# Patient Record
Sex: Male | Born: 1990
Health system: Southern US, Academic
[De-identification: ages and names within clinical notes are randomized; demographics above are authoritative.]

## PROBLEM LIST (undated history)

## (undated) ENCOUNTER — Telehealth

## (undated) ENCOUNTER — Encounter

## (undated) ENCOUNTER — Other Ambulatory Visit

## (undated) MED FILL — FLUARIX QUADRIVALENT 0.5ML SUSY: 0.5 ML | 1 days supply | Qty: 1 | Fill #0 | Status: AC

## (undated) NOTE — Progress Notes (Signed)
 Formatting of this note is different from the original. Office Visit Note:  Assessment/Plan: 1. Attention deficit hyperactivity disorder (ADHD), unspecified ADHD type   2. Cellulitis and abscess of hand   3. Alopecia   4. Healthcare maintenance    1. ADHD-he states that he has been diagnosed with ADHD and is getting Adderall from a doctor in [facility] .  He also tells me that he has a lot of anxiety and has situational depression.  He was on Wellbutrin in the past which is not taking anymore.  I told him that he needs to be seen by psychiatrist to weigh the risks and benefits of taking Adderall.  I will refer him to a psychiatrist now. 2. Cellulitis of his hand-improving, no more streaking.  On Keflex, advised him to continue the course of Keflex 3. Alopecia-all Propecia, appears stable  Health Maintenance: Immunizations: Tetanus: Last in July 2021 Influenza: got it this season Covid: 3 doses of pfizer  Screening: Hepatitis C: Deferred HIV:  Deferred  diabetes: Deferred Plan to check lipid panel with next set of labs  Orders Placed This Encounter  ? REFERRAL TO PSYCHIATRY    Referral Priority:   Routine    Referral Type:   Behavioral Health    Referral Reason:   Specialty Services Required    Referred to Provider:   Lanie Gull    Requested Specialty:   Psychiatry    Number of Visits Requested:   1  ? cephALEXin (KEFLEX) 500 mg capsule    Sig: TAKE 1 CAPSULE BY MOUTH EVERY 6 HOURS FOR 10 DAYS  ? hydrOXYzine HCL (ATARAX) 25 mg tablet    Sig: TAKE 1 TABLET BY MOUTH THREE TIMES DAILY FOR 5 DAYS AS NEEDED   Social Determinants of Health   Tobacco Use: Low Risk   ? Smoking Tobacco Use: Never Smoker  ? Smokeless Tobacco Use: Never Used  Alcohol Use:   ? Frequency of Alcohol Consumption: Not on file  ? Average Number of Drinks: Not on file  ? Frequency of Binge Drinking: Not on file  Financial Resource Strain:   ? Difficulty of Paying Living Expenses: Not on file   Food Insecurity:   ? Worried About Running Out of Food in the Last Year: Not on file  ? Ran Out of Food in the Last Year: Not on file  Transportation Needs:   ? Lack of Transportation (Medical): Not on file  ? Lack of Transportation (Non-Medical): Not on file  Physical Activity:   ? Days of Exercise per Week: Not on file  ? Minutes of Exercise per Session: Not on file  Stress:   ? Feeling of Stress : Not on file  Social Connections:   ? Frequency of Communication with Friends and Family: Not on file  ? Frequency of Social Gatherings with Friends and Family: Not on file  ? Attends Religious Services: Not on file  ? Active Member of Clubs or Organizations: Not on file  ? Attends Banker Meetings: Not on file  ? Marital Status: Not on file  Intimate Partner Violence:   ? Fear of Current or Ex-Partner: Not on file  ? Emotionally Abused: Not on file  ? Physically Abused: Not on file  ? Sexually Abused: Not on file  Depression:   ? PHQ-2 Score: Not on file  Housing Stability:   ? Unable to Pay for Housing in the Last Year: Not on file  ? Number of Places Lived in the Last  Year: Not on file  ? Unstable Housing in the Last Year: Not on file   Follow-up and Dispositions    Return in about 3 months (around 10/27/2020).   I have reviewed with the patient details of the assessment and plan and all questions were answered. Relevant patient education was performed. The most recent lab findings were reviewed with the patient. An After Visit Summary was printed and given to the patient.  Reason for Visit: Establish Care  Subjective: 53 y.o. male with h/o ADHD, alopecia who comes to establish with me as a primary care physician.  He states that he wants me to prescribe his Adderall that is why he is here.  He had a recent visit to the ED for cellulitis of his hands due to some insect bite which he states is improving.  He denies any other complaints to me.  No headaches no  blurred vision no chest pain or shortness of breath no abdominal pain no nausea no vomiting no diarrhea constipation.  Review of Systems A complete 11 system ROS was preformed (constitutional, eyes, ENT, cardiovascular, respiratory, gastrointestinal, genitourinary, musculoskeletal, skin, neurological, psychiatric) and was negative aside from the pertinent positives and negatives noted in the HPI.  Past Medical History:  Diagnosis Date  ? ADHD   ? Anxiety   ? Depression   ? Hair loss    History reviewed. No pertinent surgical history. Social History   Socioeconomic History  ? Marital status: SINGLE  Tobacco Use  ? Smoking status: Never Smoker  ? Smokeless tobacco: Never Used  Substance and Sexual Activity  ? Alcohol use: Yes    Comment: 6-8 weekly   ? Drug use: Not Currently  ? Sexual activity: Yes   Family History  Problem Relation Age of Onset  ? Psychiatric Disorder Mother   ? No Known Problems Father   ? Psychiatric Disorder Sister   ? Attention Deficit Hyperactivity Disorder Sister   ? Psychiatric Disorder Brother   ? Attention Deficit Hyperactivity Disorder Brother   ? Ulcerative Colitis Brother    Current Outpatient Medications  Medication Sig Dispense Refill  ? cephALEXin (KEFLEX) 500 mg capsule TAKE 1 CAPSULE BY MOUTH EVERY 6 HOURS FOR 10 DAYS    ? hydrOXYzine HCL (ATARAX) 25 mg tablet TAKE 1 TABLET BY MOUTH THREE TIMES DAILY FOR 5 DAYS AS NEEDED    ? amphetamine-dextroamphetamine XR (ADDERALL XR) 30 mg XR capsule Take 30 mg by mouth Every morning.    ? finasteride (PROPECIA) 1 mg tablet Take 1 mg by mouth daily.     Allergies  Allergen Reactions  ? Venom-Wasp Hives   Objective: Visit Vitals BP 126/76 (BP 1 Location: Left upper arm, BP Patient Position: Semi fowlers, BP Cuff Size: Adult)  Pulse 96  Temp 96.9 F (36.1 C) (Oral)  Resp 17  Ht 5' 6 (1.676 m)  Wt 181 lb (82.1 kg)  SpO2 98%  BMI 29.21 kg/m   Physical Exam:  AA&O x3. Not pale, not in any  distress. HEENT: ENT negative.. Lungs: clear Heart: S1 S2 +, RRR Neuro: No focal deficits. Psych: Normal affect and mood. Results for orders placed or performed during the hospital encounter of 01/19/20  CBC WITH AUTOMATED DIFF  Result Value Ref Range   WBC 9.4 4.6 - 13.2 K/uL   RBC 5.04 4.35 - 5.65 M/uL   HGB 16.3 (H) 13.0 - 16.0 g/dL   HCT 53.4 63.9 - 51.9 %   MCV 92.3 74.0 - 97.0 FL  MCH 32.3 24.0 - 34.0 PG   MCHC 35.1 31.0 - 37.0 g/dL   RDW 87.8 88.3 - 85.4 %   PLATELET 312 135 - 420 K/uL   MPV 9.2 9.2 - 11.8 FL   NEUTROPHILS 69 40 - 73 %   LYMPHOCYTES 21 21 - 52 %   MONOCYTES 7 3 - 10 %   EOSINOPHILS 3 0 - 5 %   BASOPHILS 0 0 - 2 %   ABS. NEUTROPHILS 6.4 1.8 - 8.0 K/UL   ABS. LYMPHOCYTES 1.9 0.9 - 3.6 K/UL   ABS. MONOCYTES 0.6 0.05 - 1.2 K/UL   ABS. EOSINOPHILS 0.3 0.0 - 0.4 K/UL   ABS. BASOPHILS 0.0 0.0 - 0.1 K/UL   DF AUTOMATED    METABOLIC PANEL, COMPREHENSIVE  Result Value Ref Range   Sodium 138 136 - 145 mmol/L   Potassium 3.8 3.5 - 5.5 mmol/L   Chloride 106 100 - 111 mmol/L   CO2 29 21 - 32 mmol/L   Anion gap 3 3.0 - 18 mmol/L   Glucose 90 74 - 99 mg/dL   BUN 12 7.0 - 18 MG/DL   Creatinine 9.07 0.6 - 1.3 MG/DL   BUN/Creatinine ratio 13 12 - 20     GFR est AA >60 >60 ml/min/1.1m2   GFR est non-AA >60 >60 ml/min/1.27m2   Calcium 9.1 8.5 - 10.1 MG/DL   Bilirubin, total 0.6 0.2 - 1.0 MG/DL   ALT (SGPT) 849 (H) 16 - 61 U/L   AST (SGOT) 43 (H) 10 - 38 U/L   Alk. phosphatase 115 45 - 117 U/L   Protein, total 8.3 (H) 6.4 - 8.2 g/dL   Albumin 4.0 3.4 - 5.0 g/dL   Globulin 4.3 (H) 2.0 - 4.0 g/dL   A-G Ratio 0.9 0.8 - 1.7     Renee Artist, MD, FACP, [facility]. [facility] [place], [place], [place].   Electronically signed by Nair, Prem K, MD at 07/29/2020  9:22 AM EST

## (undated) NOTE — Progress Notes (Signed)
 Formatting of this note is different from the original. Chief Complaint  Patient presents with  ? Establish Care   1. Have you been to the ER, urgent care clinic since your last visit?  Hospitalized since your last visit?No  2. Have you seen or consulted any other health care providers outside of the [facility] since your last visit?  Include any pap smears or colon screening. No  Electronically signed by Tanda Rock ORN at 07/29/2020  8:56 AM EST

---
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
## 2009-01-08 ENCOUNTER — Ambulatory Visit: Payer: Self-pay | Admitting: Family Medicine

## 2016-07-12 ENCOUNTER — Encounter (HOSPITAL_COMMUNITY): Payer: Self-pay

## 2016-07-12 ENCOUNTER — Emergency Department (HOSPITAL_COMMUNITY): Payer: BLUE CROSS/BLUE SHIELD

## 2016-07-12 ENCOUNTER — Emergency Department (HOSPITAL_COMMUNITY)
Admission: EM | Admit: 2016-07-12 | Discharge: 2016-07-12 | Disposition: A | Discharge status: Home / Self Care | Payer: BLUE CROSS/BLUE SHIELD | Attending: Emergency Medicine | Admitting: Emergency Medicine

## 2016-07-12 DIAGNOSIS — Y939 Activity, unspecified: Secondary | ICD-10-CM | POA: Diagnosis not present

## 2016-07-12 DIAGNOSIS — Z79899 Other long term (current) drug therapy: Secondary | ICD-10-CM | POA: Insufficient documentation

## 2016-07-12 DIAGNOSIS — F909 Attention-deficit hyperactivity disorder, unspecified type: Secondary | ICD-10-CM | POA: Insufficient documentation

## 2016-07-12 DIAGNOSIS — Y999 Unspecified external cause status: Secondary | ICD-10-CM | POA: Insufficient documentation

## 2016-07-12 DIAGNOSIS — S060X0A Concussion without loss of consciousness, initial encounter: Secondary | ICD-10-CM | POA: Diagnosis not present

## 2016-07-12 DIAGNOSIS — Y929 Unspecified place or not applicable: Secondary | ICD-10-CM | POA: Diagnosis not present

## 2016-07-12 DIAGNOSIS — S0990XA Unspecified injury of head, initial encounter: Secondary | ICD-10-CM | POA: Diagnosis present

## 2016-07-12 DIAGNOSIS — W01198A Fall on same level from slipping, tripping and stumbling with subsequent striking against other object, initial encounter: Secondary | ICD-10-CM | POA: Diagnosis not present

## 2016-07-12 LAB — URINALYSIS, ROUTINE W REFLEX MICROSCOPIC
BILIRUBIN URINE: NEGATIVE
Glucose, UA: NEGATIVE mg/dL
Hgb urine dipstick: NEGATIVE
KETONES UR: NEGATIVE mg/dL
LEUKOCYTES UA: NEGATIVE
NITRITE: NEGATIVE
Protein, ur: NEGATIVE mg/dL
SPECIFIC GRAVITY, URINE: 1.017 (ref 1.005–1.030)
pH: 5 (ref 5.0–8.0)

## 2016-07-12 LAB — RAPID URINE DRUG SCREEN, HOSP PERFORMED
Amphetamines: NOT DETECTED
BENZODIAZEPINES: POSITIVE — AB
Barbiturates: NOT DETECTED
COCAINE: NOT DETECTED
OPIATES: NOT DETECTED
Tetrahydrocannabinol: NOT DETECTED

## 2016-07-12 LAB — I-STAT CHEM 8, ED
BUN: 12 mg/dL (ref 6–20)
Calcium, Ion: 1.21 mmol/L (ref 1.15–1.40)
Chloride: 103 mmol/L (ref 101–111)
Creatinine, Ser: 0.9 mg/dL (ref 0.61–1.24)
Glucose, Bld: 84 mg/dL (ref 65–99)
HEMATOCRIT: 39 % (ref 39.0–52.0)
Hemoglobin: 13.3 g/dL (ref 13.0–17.0)
POTASSIUM: 3.9 mmol/L (ref 3.5–5.1)
SODIUM: 141 mmol/L (ref 135–145)
TCO2: 25 mmol/L (ref 0–100)

## 2016-07-12 LAB — ETHANOL: Alcohol, Ethyl (B): <5 mg/dL (ref <5)

## 2016-07-12 MED ORDER — ACETAMINOPHEN 500 MG PO TABS: 500.0000 mg | ORAL_TABLET | Freq: Four times a day (QID) | ORAL | 0 refills | Status: DC | PRN

## 2016-07-12 NOTE — ED Notes (Signed)
No respiratory or acute distress noted alert and oriented x 3 clear speech noted moves all extremities visitor at bedside call light in reach. 

## 2016-07-12 NOTE — ED Provider Notes (Signed)
[facility]-EMERGENCY DEPT Provider Note   CSN: [id number] Arrival date & time: 07/12/16  1626     History   Chief Complaint Chief Complaint  Patient presents with  . Head Injury  . Fall    HPI Tommy Crawford is a 25 y.o. male.  Tommy Crawford is a 25 y.o. Male who presents to the ED with his brother and friend who report last night after drinking some alcohol the patient tripped on a side table and fell. They report after this fall his balance has been off and he has been more sleepy since. They report he was drinking alcohol, but "not that much." They report today he has been more sleepy and fatigued. Family reports he seems to be improving and his gait is much better. Patient tells me he just feels tired and denies other complaints. He has taken nothing for treatment today. He denies headache. He denies alcohol or illicit substance use today. He denies fevers, neck pain, back pain, numbness, tingling, weakness, changes to his vision, headache, abdominal pain, nausea, vomiting, or double vision.   The history is provided by the patient, a friend and a relative. No language interpreter was used.  Head Injury   Pertinent negatives include no numbness, no vomiting and no weakness.  Fall  Pertinent negatives include no chest pain, no abdominal pain, no headaches and no shortness of breath.    History reviewed. No pertinent past medical history.  Patient Active Problem List   Diagnosis Date Noted  . ADD 07/24/2008    History reviewed. No pertinent surgical history.     Home Medications    Prior to Admission medications   Medication Sig Start Date End Date Taking? Authorizing Provider  ADDERALL XR 30 MG 24 hr capsule Take 30 mg by mouth every morning. 06/16/16  Yes Historical Provider, MD  finasteride (PROPECIA) 1 MG tablet Take 1 mg by mouth daily. 05/28/16  Yes Historical Provider, MD  acetaminophen (TYLENOL) 500 MG tablet Take 1 tablet (500 mg total) by mouth  every 6 (six) hours as needed for headache. 07/12/16   Everlene FarrierWilliam Donielle Kaigler, PA-C    Family History History reviewed. No pertinent family history.  Social History Social History  Substance Use Topics  . Smoking status: Never Smoker  . Smokeless tobacco: Never Used  . Alcohol use Yes     Allergies   Patient has no known allergies.   Review of Systems Review of Systems  Constitutional: Positive for fatigue. Negative for chills and fever.  HENT: Negative for congestion and sore throat.   Eyes: Negative for pain and visual disturbance.  Respiratory: Negative for cough and shortness of breath.   Cardiovascular: Negative for chest pain.  Gastrointestinal: Negative for abdominal pain, nausea and vomiting.  Genitourinary: Negative for difficulty urinating and dysuria.  Musculoskeletal: Positive for gait problem. Negative for back pain, neck pain and neck stiffness.  Skin: Negative for rash.  Neurological: Negative for syncope, weakness, numbness and headaches.     Physical Exam Updated Vital Signs BP 127/79 (BP Location: Left Arm)   Pulse 84   Temp 98.6 F (37 C) (Oral)   Resp 18   Ht 5\' 7"  (1.702 m)   Wt 65.8 kg   SpO2 98%   BMI 22.71 kg/m   Physical Exam  Constitutional: He is oriented to person, place, and time. He appears well-developed and well-nourished. No distress.  Nontoxic-appearing.  HENT:  Head: Normocephalic and atraumatic.  Right Ear: External ear normal.  Left  Ear: External ear normal.  Nose: Nose normal.  Mouth/Throat: Oropharynx is clear and moist.  Bilateral tympanic membranes are pearly-gray without erythema or loss of landmarks.  No visible are palpated evidence of head injury or trauma.  Eyes: Conjunctivae and EOM are normal. Pupils are equal, round, and reactive to light. Right eye exhibits no discharge. Left eye exhibits no discharge.  Neck: Normal range of motion. Neck supple. No JVD present.  No midline neck tenderness.  Cardiovascular: Normal  rate, regular rhythm, normal heart sounds and intact distal pulses.  Exam reveals no gallop and no friction rub.   No murmur heard. Bilateral radial pulses are intact.   Pulmonary/Chest: Effort normal and breath sounds normal. No stridor. No respiratory distress. He has no wheezes. He has no rales. He exhibits no tenderness.  Abdominal: Soft. There is no tenderness. There is no guarding.  Abdomen is soft nontender to palpation.  Musculoskeletal: Normal range of motion. He exhibits no edema.  Lymphadenopathy:    He has no cervical adenopathy.  Neurological: He is alert and oriented to person, place, and time. He displays normal reflexes. No cranial nerve deficit or sensory deficit. Coordination normal.  Patient is alert and oriented 3. He has amnesia to the event of falling last night. Cranial nerves are intact. Speech is clear and coherent. EOMs are intact. Finger to nose is intact. Normal gait. Bilateral patellar DTRs are intact. No pronator drift. Sensation is intact his bilateral upper and lower extremities.  Skin: Skin is warm and dry. Capillary refill takes less than 2 seconds. No rash noted. He is not diaphoretic. No erythema. No pallor.  Psychiatric: He has a normal mood and affect. His behavior is normal.  Nursing note and vitals reviewed.    ED Treatments / Results  Labs (all labs ordered are listed, but only abnormal results are displayed) Labs Reviewed  RAPID URINE DRUG SCREEN, HOSP PERFORMED - Abnormal; Notable for the following:       Result Value   Benzodiazepines POSITIVE (*)    All other components within normal limits  ETHANOL  URINALYSIS, ROUTINE W REFLEX MICROSCOPIC  I-STAT CHEM 8, ED    EKG  EKG Interpretation None       Radiology Ct Head Wo Contrast  Result Date: 07/12/2016 CLINICAL DATA:  Unsteady gait and confusion after fall and hitting head x2 last night. EXAM: CT HEAD WITHOUT CONTRAST TECHNIQUE: Contiguous axial images were obtained from the base of  the skull through the vertex without intravenous contrast. COMPARISON:  None. FINDINGS: BRAIN: The ventricles and sulci are normal. No intraparenchymal hemorrhage, mass effect nor midline shift. No acute large vascular territory infarcts. Grey-white matter distinction is maintained. The basal ganglia are unremarkable. No abnormal extra-axial fluid collections. Basal cisterns are patent. The brainstem and cerebellar hemispheres are without acute abnormalities. VASCULAR: Unremarkable. SKULL/SOFT TISSUES: No skull fracture. No significant soft tissue swelling. ORBITS/SINUSES: The included ocular globes and orbital contents are normal.The mastoid air cells are clear. The included paranasal sinuses are well-aerated. OTHER: None. IMPRESSION: No acute intracranial abnormality.  No acute osseous abnormality. Electronically Signed   By: Tollie Ethavid  Kwon M.D.   On: 07/12/2016 19:19    Procedures Procedures (including critical care time)  Medications Ordered in ED Medications - No data to display   Initial Impression / Assessment and Plan / ED Course  I have reviewed the triage vital signs and the nursing notes.  Pertinent labs & imaging results that were available during my care of the patient  were reviewed by me and considered in my medical decision making (see chart for details).  Clinical Course    This is a 25 y.o. Male who presents to the ED with his brother and friend who report last night after drinking some alcohol the patient tripped on a side table and fell. They report after this fall his balance has been off and he has been more sleepy since. They report he was drinking alcohol, but "not that much." They report today he has been more sleepy and fatigued. Family reports he seems to be improving and his gait is much better. Patient tells me he just feels tired and denies other complaints. He has taken nothing for treatment today. He denies headache. He denies alcohol or illicit substance use today.  On  exam the patient is afebrile nontoxic appearing. He has no focal neurological deficits. He is able to ambulate with normal gait. Speech is clear and coherent. No visible or palpated signs of head injury or trauma. I-STAT Chem-8 is unremarkable. Ethanol level is undetectable. Urinalysis without abnormalities. Urine drug screen is positive for benzodiazepines only. CT head without contrast is unremarkable. At recheck patient tells me he is feeling back to normal. He feels ready for discharge. I discussed about the benzodiazepines. Father, who is at bedside thinks this might be the culprit. He believes he might have actually taken a benzodiazepine and this caused his gait to be wobbly with a combination of alcohol. This seems to be likely is a patient denies any headache. I do suspect he likely has a concussion as he has amnesia to the event. I discussed head injury return precautions. I discussed the expected course and treatment of a concussion. I advised to avoid activities that would risk his head injured. I advised the patient to follow-up with their primary care provider this week. I advised the patient to return to the emergency department with new or worsening symptoms or new concerns. The patient and father verbalized understanding and agreement with plan.      Final Clinical Impressions(s) / ED Diagnoses   Final diagnoses:  Concussion without loss of consciousness, initial encounter    New Prescriptions New Prescriptions   ACETAMINOPHEN (TYLENOL) 500 MG TABLET    Take 1 tablet (500 mg total) by mouth every 6 (six) hours as needed for headache.     Everlene Farrier, PA-C 07/12/16 2037    Pricilla Loveless, MD 07/13/16 [phone]

## 2016-07-12 NOTE — ED Triage Notes (Addendum)
Pt c/o unsteady gait and confusion after falling and hitting head last night x 2.  Denies pain.  Pt reports "I'm moving slow."  Pt's friend reports that the Pt was drinking ETOH last night prior to the fall and Pt fell out of bed last night.    Pupils are equal and reactive.

## 2016-07-12 NOTE — ED Notes (Signed)
Pt is aware urine is needed 

## 2017-02-26 DIAGNOSIS — Z021 Encounter for pre-employment examination: Principal | ICD-10-CM

## 2017-04-15 ENCOUNTER — Encounter: Attending: Family Medicine

## 2017-06-23 ENCOUNTER — Ambulatory Visit: Attending: Family Medicine

## 2017-06-23 DIAGNOSIS — Z23 Encounter for immunization: Principal | ICD-10-CM

## 2017-06-23 DIAGNOSIS — F902 Attention-deficit hyperactivity disorder, combined type: Secondary | ICD-10-CM

## 2017-06-23 MED ORDER — FINASTERIDE PO LIQD 1 MG/ML
1 mg | ORAL
Start: 2017-06-23 — End: 2017-06-24

## 2017-06-23 MED ORDER — AMPHETAMINE-DEXTROAMPHETAMINE 30 MG PO TABS
30 mg | Freq: Every day | ORAL | 0 refills | Status: CP
Start: 2017-06-23 — End: 2017-06-24

## 2017-06-23 MED ORDER — AMPHETAMINE-DEXTROAMPHETAMINE 30 MG PO TABS
30 mg | Freq: Every day | ORAL
Start: 2017-06-23 — End: 2017-06-24

## 2017-06-23 MED ORDER — AMPHETAMINE-DEXTROAMPHETAMINE 30 MG PO TABS
30 mg | Freq: Every day | ORAL | 0 refills | Status: CP
Start: 2017-06-23 — End: 2017-08-26

## 2017-06-23 MED ORDER — FINASTERIDE 1 MG PO TABS
1 mg | ORAL
Start: 2017-06-23 — End: 2017-07-24

## 2017-06-24 NOTE — Progress Notes
?   Alcohol use Yes      Comment: occassionally    ? Drug use: Unknown   ? Sexual activity: Yes     Partners: Female     Other Topics Concern   ? Not on file     Social History Narrative   ? No narrative on file     No current outpatient prescriptions on file prior to visit.     No current facility-administered medications on file prior to visit.      No Known Allergies      Review of Systems  See HPI        Objective:        VITAL SIGNS (all recorded)      Clinic Vitals     Row Name 06/23/17 1546                Amb Encounter Vitals    Weight 70.5 kg (155 lb 8 oz)    -VY at 06/23/17 1547       Height 1.676 m (5\' 6" )    -VY at 06/23/17 1547       BMI (Calculated) 25.15    -VY at 06/23/17 1547       BSA (Calculated - sq m) 1.81    -VY at 06/23/17 1547       BP 129/81    -VY at 06/23/17 1547       BP Location Left upper arm    -VY at 06/23/17 1547       Position Sitting    -VY at 06/23/17 1547       Pulse 87    -VY at 06/23/17 1547       Pulse Source Radial    -VY at 06/23/17 1547       Pulse Quality Normal    -VY at 06/23/17 1547       Resp 16    -VY at 06/23/17 1547       Respiration Quality Normal    -VY at 06/23/17 1547       Temp 36.6 ?C (97.8 ?F)    -VY at 06/23/17 1547       Temperature Source Oral    -VY at 06/23/17 1547       Pain Score Zero    -VY at 06/23/17 1547          Education/Communication Barriers?    Learning/Communication Barriers? No    -VY at 06/23/17 1547          Fall Risk Assessment    Had recent fall / Last 6 months? No recent fall    -VY at 06/23/17 1547       Does patient have a fear of falling? No    -VY at 06/23/17 1547         User Key  (r) = Recorded By, (t) = Taken By, (c) = Cosigned By    Initials Name Effective Dates    Phoebe PerchVY Young, Vanessa, [place] 10/22/15 -         Physical Exam   Vital signs reviewed. Alert and oriented x 3, well developed, well nourished, BMI 25, NAD. HEENT: PERRLA, EOMI. Conjunctiva not injected. Neck: Supple. No adenopathy in the neck or supraclavicular area. No

## 2017-06-24 NOTE — Progress Notes
?   USPSTF HIV Risk Assessment  Completed   ? Influenza Vaccine  Completed   ? Varicella Vaccine  Excluded

## 2017-06-24 NOTE — Progress Notes
Orders Placed This Encounter   Medications   ? DISCONTD: amphetamine-dextroamphetamine (ADDERALL) 30 MG PO Tablet     Sig: Take 1 tablet by mouth daily.     Dispense:  30 tablet     Refill:  0     Order Specific Question:   Prior to prescribing this controlled substance, has the [place] PDMP website been accessed by yourself or designated staff?     Answer:   Yes, The PDMP website has been accessed   ? DISCONTD: amphetamine-dextroamphetamine (ADDERALL) 30 MG PO Tablet     Sig: Take 1 tablet by mouth daily. Earliest Fill Date: 07/12/17     Dispense:  30 tablet     Refill:  0     Order Specific Question:   Prior to prescribing this controlled substance, has the [place] PD[place] website been accessed by yourself or designated staff?     Answer:   Yes, The PDMP website has been accessed   ? DISCONTD: amphetamine-dextroamphetamine (ADDERALL) 30 MG PO Tablet     Sig: Take 1 tablet by mouth daily. Earliest Fill Date: 08/11/17     Dispense:  30 tablet     Refill:  0     Order Specific Question:   Prior to prescribing this controlled substance, has the [place] PD[place] website been accessed by yourself or designated staff?     Answer:   Yes, The PDMP website has been accessed   ? amphetamine-dextroamphetamine (ADDERALL) 30 MG PO Tablet     Sig: Take 1 tablet by mouth daily. Earliest Fill Date: 09/10/17     Dispense:  30 tablet     Refill:  0     Order Specific Question:   Prior to prescribing this controlled substance, has the [place] PD[place] website been accessed by yourself or designated staff?     Answer:   Yes, The PDMP website has been accessed     Orders Placed This Encounter   Procedures   ? Flu vaccine (6 mo and older) QUADRIVALENT, Preserv-Free, Single dose, IM       Health Maintenance was reviewed. The patient's HM Topic list was:                                            Health Maintenance   Topic Date Due   ? Preventive Wellness Visit  02/19/2008   ? DTaP,Tdap,and Td Vaccines (1 - Tdap) 02/18/2009

## 2017-06-24 NOTE — Progress Notes
Subjective:   Robert Velasquez is a 26 y.o. male being seen today for Establish Care (E forcse checked); Chronic Care (HIV Risk Assessment,Wellness); and Medications Refill (Adderall XR 30mg  one a day)       HPI  26 year old male here to establish. States he recently moved here to work a [employer] [facility]. 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 He states he mainly wants to establish to get a refill of medications for ADHD. He has had the dx since around age 89. States there was a time in adolescents when he was also treated for depression but it resolved. He has not been following up with Psychiatry. States the medication mainly helps to keep him focused on tasks. He has had less hyperactivity as he has entered adulthood. Has not had any recent holidays. Denies any other health issuses. He does not smoke or drink. Does not exercise regularly. States he sleeps well. Rates his functional status as good overall. HIV risk assessment: Patient denies increased HIV risk including sex with men, multiple sexual partners, IVDU history, weight loss or night sweats.   Denies any adverse effects from current medications. Denies any fever or chills. Denies any chest pain, palpitations or increased shortness of breath. No cough or hemoptysis.  Denies any nausea or vomiting. Denies any change in bowel function or hematochezia.  Denies any genitourinary complaints. Denies paresthesias or weakness.  No joint swelling. No impairments in gait or balance, no falls. No visual complaints and no cognitive complaints. Hearing is normal. ROS otherwise negative.        No past medical history on file.  No past surgical history on file.  No family history on file.  Social History     Social History   ? Marital status: Unknown     Spouse name: N/A   ? Number of children: N/A   ? Years of education: N/A     Occupational History   ? Not on file.     Social History Main Topics   ? Smoking status: Never Smoker   ? Smokeless tobacco: Never Used

## 2017-06-24 NOTE — Progress Notes
thyromegaly, no bruits, no JVDs. Heart: RRR with no murmurs noted. Lungs: Clear to ausc. Good respiratory effort. Abdomen: Positive bowel sounds, soft, nontender. No hepatosplenomegaly. Extremities: WNL with no clubbing, cyanosis, or edema. Pulses intact x four extremities. Joints: no effusions or swelling noted. Gait and station normal. Skin: Appears intact and normal to palpation. Neuro: Cranial nerves intact. No abnormality or focal deficits in extremities x four. No tremors or ataxia noted.      Labs or Imaging Review:   Lab Requisition on 02/26/2017   Component Date Value   ? Hepatitis B Surface Anti* 02/26/2017 Positive    ? Mumps IgG Antibody 02/26/2017 Positive*   ? Rubella Immune Status 02/26/2017 Not Immune*   ? Rubeola IgG Antibody 02/26/2017 Equivocal*   ? Varicella Immune Status 02/26/2017 Immune    ? HDL 02/26/2017 72    ? Cholesterol, Total 02/26/2017 170    ? Glucose 02/26/2017 101*       Assessment:       ICD-10-CM ICD-9-CM    1. Need for prophylactic vaccination and inoculation against influenza Z23 V04.81 Flu vaccine (6 mo and older) QUADRIVALENT, Preserv-Free, Single dose, IM   2. Attention deficit hyperactivity disorder (ADHD), combined type F90.2 314.01 amphetamine-dextroamphetamine (ADDERALL) 30 MG PO Tablet      DISCONTINUED: amphetamine-dextroamphetamine (ADDERALL) 30 MG PO Tablet      DISCONTINUED: amphetamine-dextroamphetamine (ADDERALL) 30 MG PO Tablet      DISCONTINUED: amphetamine-dextroamphetamine (ADDERALL) 30 MG PO Tablet          Plan:   Immunizations: Flu. Discussed also Td update- he will check his records.   RX:  Continue current prescription medications  Goals: Increase exercise, BP < 130/80, stable function.   Referrals:  Discussed Psychiatry prn  Education / Counseling: Diet, medical condition, health maintenance, exercise, medications,medication risks of addiction/dependency, treatment plans and options.  Follow-up: 3 Months  or sooner prn.

## 2017-07-23 DIAGNOSIS — Z87438 Personal history of other diseases of male genital organs: Principal | ICD-10-CM

## 2017-07-23 DIAGNOSIS — F909 Attention-deficit hyperactivity disorder, unspecified type: Principal | ICD-10-CM

## 2017-07-23 MED ORDER — FINASTERIDE 1 MG PO TABS
1 mg | Freq: Every day | ORAL | 1 refills | Status: CP
Start: 2017-07-23 — End: 2017-08-26

## 2017-07-23 MED ORDER — AMPHETAMINE-DEXTROAMPHET ER 30 MG PO CP24
30 mg | Freq: Every morning | ORAL | 0 refills | Status: CP
Start: 2017-07-23 — End: 2017-08-26

## 2017-08-06 IMAGING — CT CT HEAD W/O CM
3 of 4 series · 14 of 47 positions shown, 16 images · non-contrast
Comparison: None.

CLINICAL DATA: Unsteady gait and confusion after fall and hitting
head x2 last night.

EXAM:
CT HEAD WITHOUT CONTRAST
TECHNIQUE: Contiguous axial images were obtained from the base of the skull
through the vertex without intravenous contrast.

[Series 2: head w/o · axial · non-contrast · 0.45mm/px · z∈[-192,-52]mm · 8 of 34 slices shown, 10 images]
[im 3/34  brain]
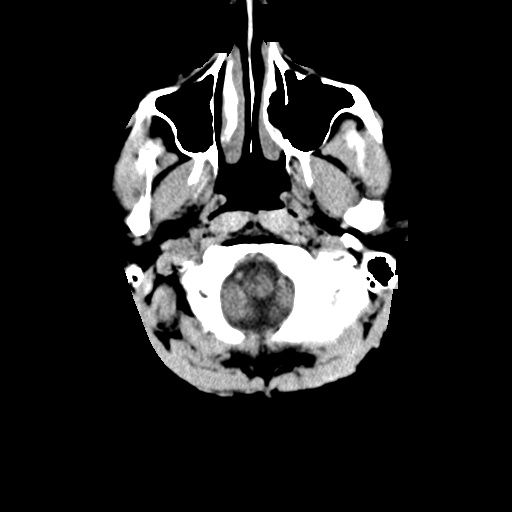
[im 3/34  bone]
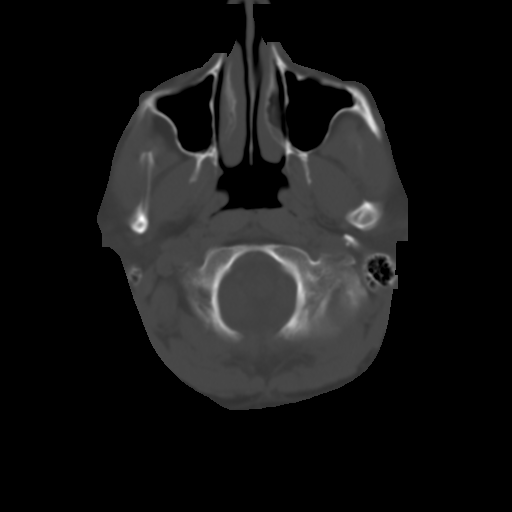
[im 8/34  brain]
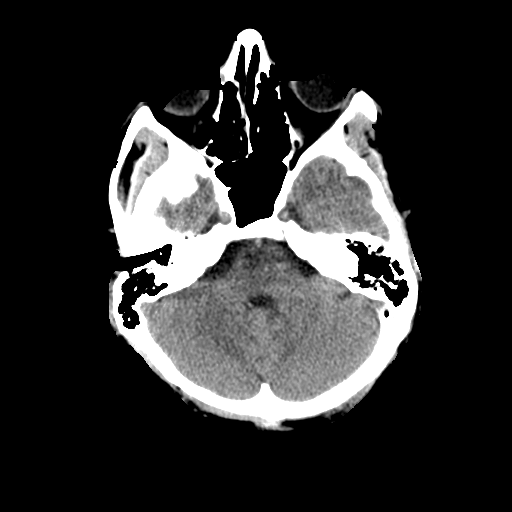
[im 12/34  brain]
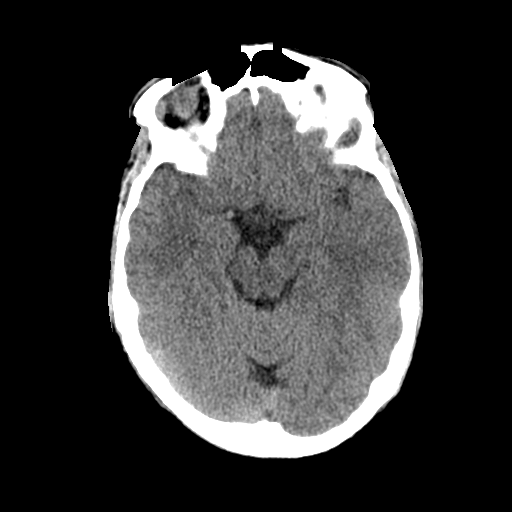
[im 15/34  brain]
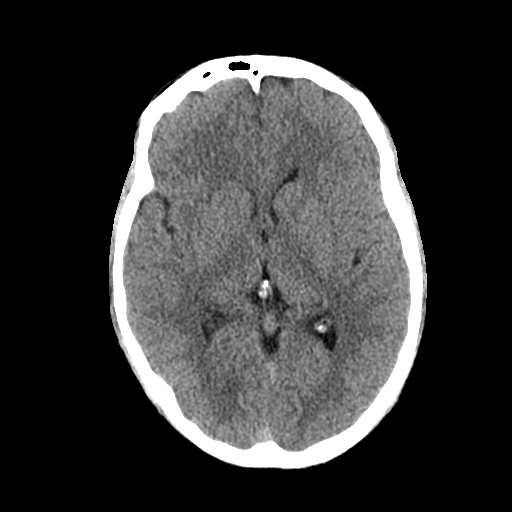
[im 19/34  brain]
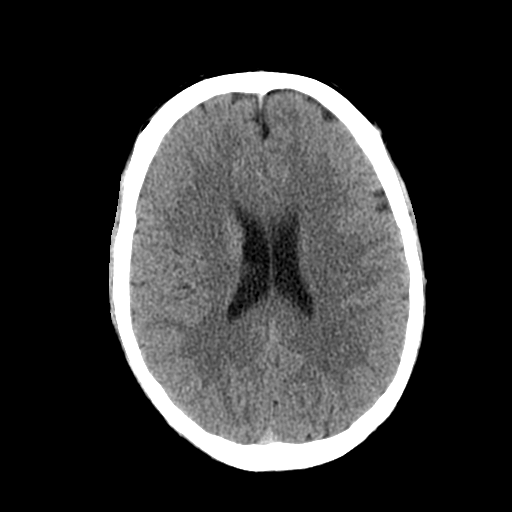
[im 19/34  bone]
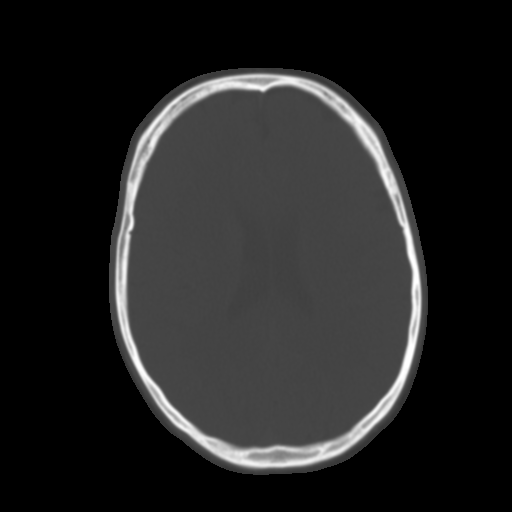
[im 22/34  brain]
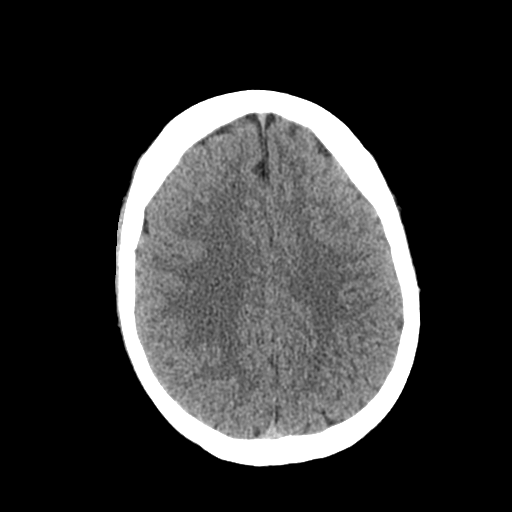
[im 26/34  brain]
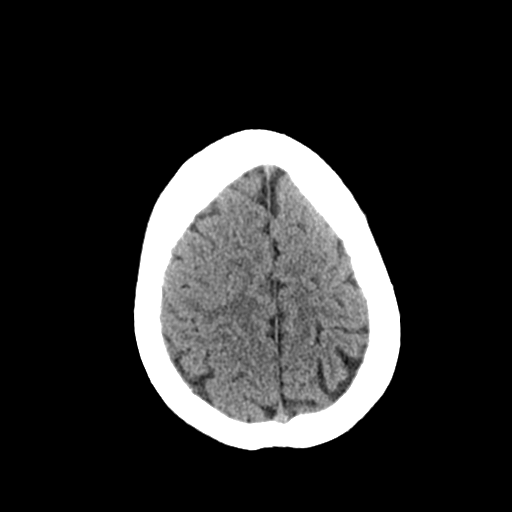
[im 31/34  brain]
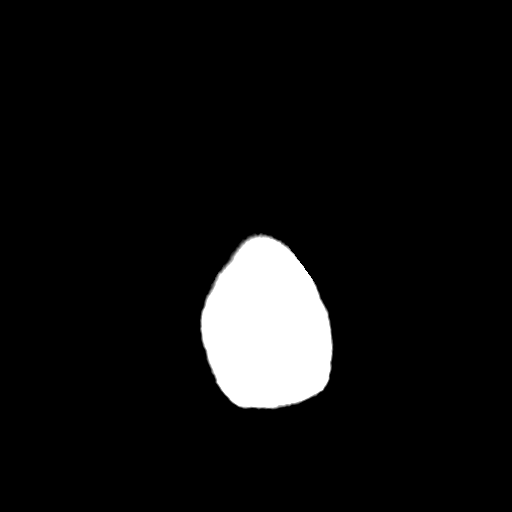

[Series 4: coronal · coronal · 0.33mm/px · 3 of 77 slices shown]
[im 26/77  brain]
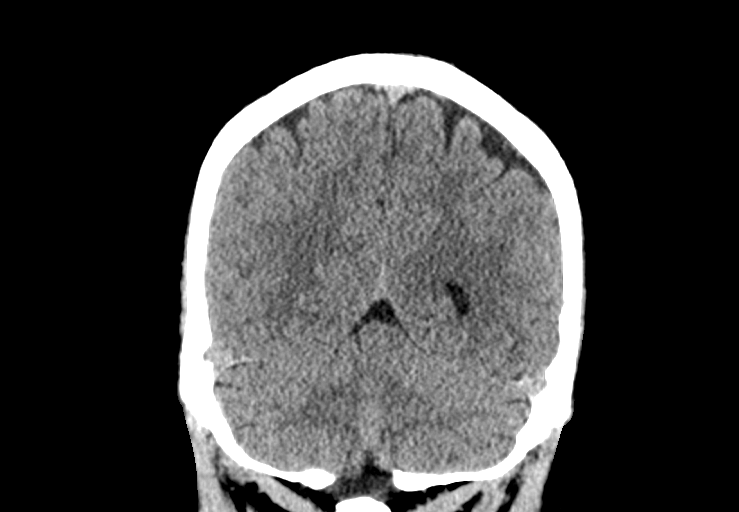
[im 34/77  brain]
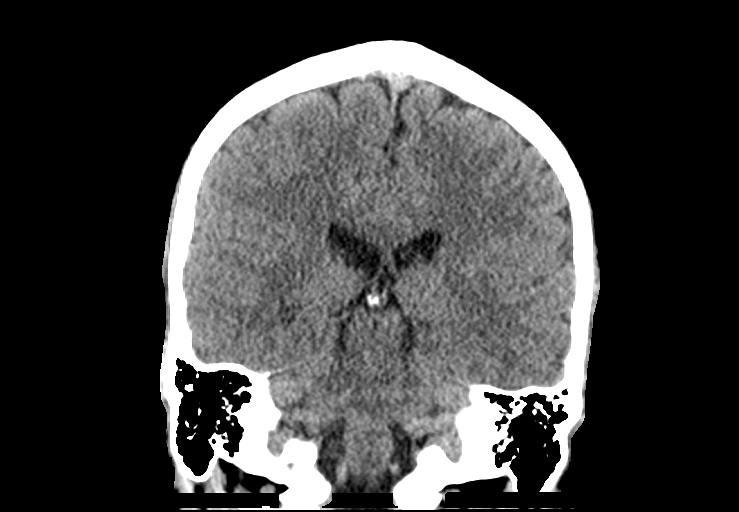
[im 43/77  brain]
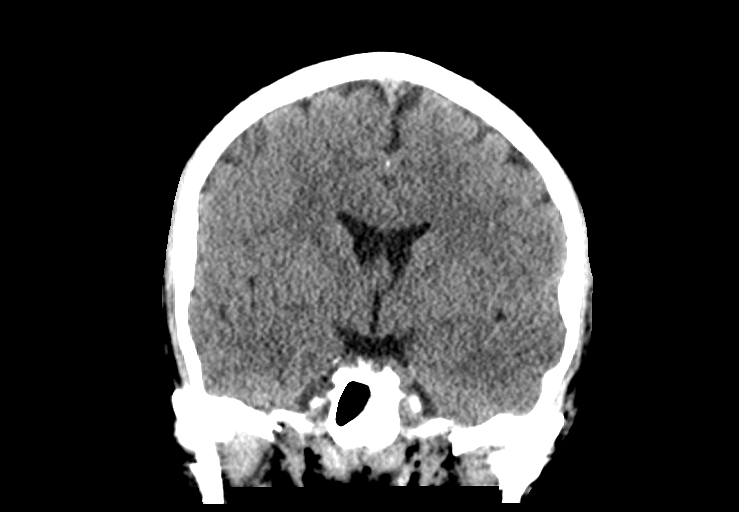

[Series 5: sagittal · sagittal · 0.33mm/px · 3 of 77 slices shown]
[im 26/77  brain]
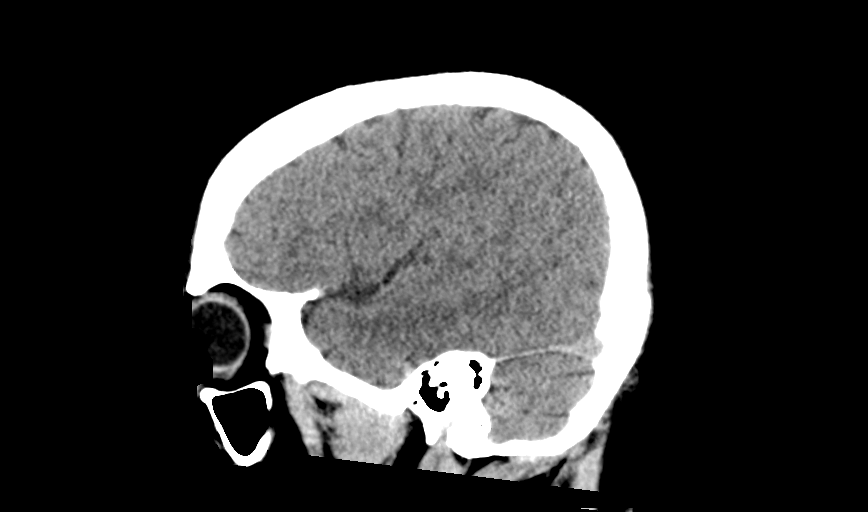
[im 39/77  brain]
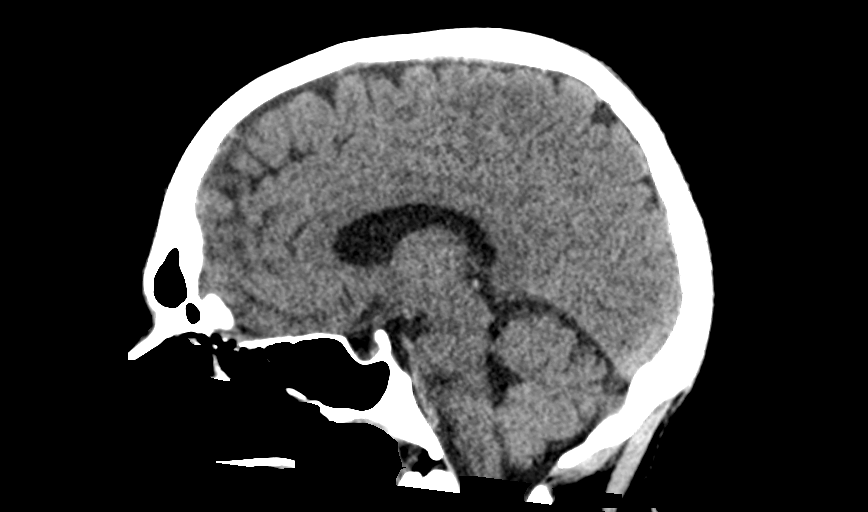
[im 51/77  brain]
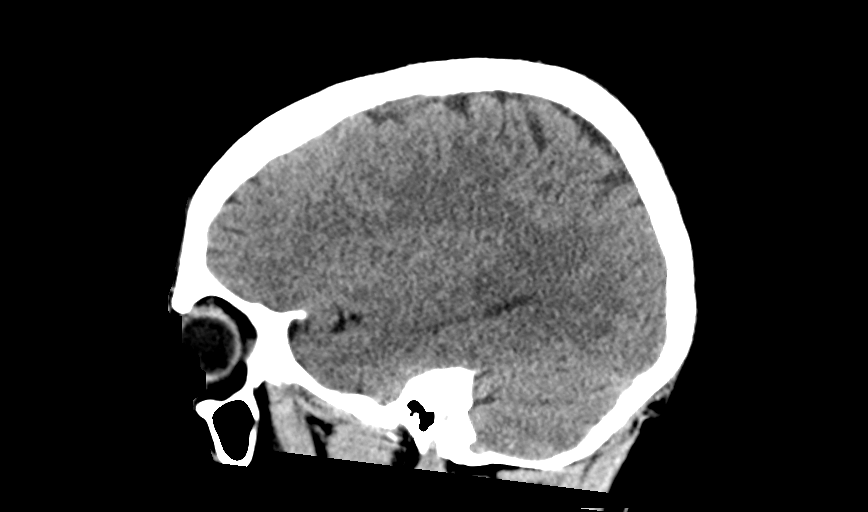

[14 of 47 positions shown; findings below may reference images not displayed]

FINDINGS: BRAIN: The ventricles and sulci are normal. No intraparenchymal
hemorrhage, mass effect nor midline shift. No acute large vascular
territory infarcts. Grey-white matter distinction is maintained. The
basal ganglia are unremarkable. No abnormal extra-axial fluid
collections. Basal cisterns are patent. The brainstem and cerebellar
hemispheres are without acute abnormalities.

VASCULAR: Unremarkable.

SKULL/SOFT TISSUES: No skull fracture. No significant soft tissue
swelling.

ORBITS/SINUSES: The included ocular globes and orbital contents are
normal.The mastoid air cells are clear. The included paranasal
sinuses are well-aerated.

OTHER: None.
IMPRESSION: No acute intracranial abnormality.  No acute osseous abnormality.

## 2017-08-25 ENCOUNTER — Encounter

## 2017-08-25 ENCOUNTER — Ambulatory Visit: Attending: Family

## 2017-08-25 DIAGNOSIS — Z79899 Other long term (current) drug therapy: Secondary | ICD-10-CM

## 2017-08-25 DIAGNOSIS — F902 Attention-deficit hyperactivity disorder, combined type: Principal | ICD-10-CM

## 2017-08-25 DIAGNOSIS — Z6825 Body mass index (BMI) 25.0-25.9, adult: Secondary | ICD-10-CM

## 2017-08-25 DIAGNOSIS — Z87438 Personal history of other diseases of male genital organs: Secondary | ICD-10-CM

## 2017-08-25 DIAGNOSIS — Z789 Other specified health status: Secondary | ICD-10-CM

## 2017-08-25 MED ORDER — AMPHETAMINE-DEXTROAMPHETAMINE 30 MG PO TABS
30 mg | Freq: Every day | ORAL | 0 refills | Status: CN
Start: 2017-08-25 — End: ?

## 2017-08-25 MED ORDER — AMPHETAMINE-DEXTROAMPHET ER 30 MG PO CP24
30 mg | Freq: Every morning | ORAL | 0 refills | Status: CP
Start: 2017-08-25 — End: ?

## 2017-08-25 MED ORDER — FINASTERIDE 1 MG PO TABS
1 mg | Freq: Every day | ORAL | 3 refills | Status: CP
Start: 2017-08-25 — End: ?

## 2017-08-25 MED ORDER — AMPHETAMINE-DEXTROAMPHET ER 30 MG PO CP24
30 mg | Freq: Every morning | ORAL | 0 refills | Status: CP
Start: 2017-08-25 — End: 2017-08-26

## 2017-08-27 ENCOUNTER — Encounter

## 2017-08-27 DIAGNOSIS — F902 Attention-deficit hyperactivity disorder, combined type: Principal | ICD-10-CM

## 2017-08-27 DIAGNOSIS — Z79899 Other long term (current) drug therapy: Secondary | ICD-10-CM

## 2017-08-30 NOTE — Patient Instructions
behalf. Please talk with your family, and with me, about your wishes. It is important to provide information about your decisions, and any formal advance directives, for your medical record.     Additional Support:  Sometimes it can be challenging to manage all aspects of daily life. Finding the right support can help you maintain or improve your health and independence. Please let me know if you would like to talk further about finding resources to assist you.    PREVENTATIVE SCREENING & IMMUNIZATION RECOMMENDATIONS  Below are your current Health maintenance plans and completion dates, if applicable.  Health Maintenance Reminders       Due Completion Dates    DTaP,Tdap,and Td Vaccines (1 - Tdap) 10/23/2017 (Originally 02/18/2009) ---    Preventive Wellness Visit 08/25/2018 08/25/2017 (Done)          Health Maintenance Topics with due status: Completed       Topic Last Completion Date    USPSTF HIV Risk Assessment 06/23/2017    Influenza Vaccine 06/23/2017     Health Maintenance Topics with due status: Excluded       Topic Date Due    Varicella Vaccine Excluded        The following were addressed and declined:  The patient has no Health Maintenance topics of status Addressed or Aged Out     Preventative Screening Recommendations:  Below are general recommendations.  You may need them more frequently, less frequently, or not at all as we discussed during your visit, based on your individual needs, risk factors and preferences.    Preventive Service Why Status   Blood Pressure      Age: 9218 and older     How Often: Every visit, atleast every year To help prevent problems related to high blood pressure (heart attack and kidney problems) or low blood pressure (falls and dizziness). Most Recent BP Reading   08/25/17 138/82      Cardiovascular screening blood tests     (Cholesterol, Lipids, Triglycerides)    Age: Initial test for all at age 27; repeat at 1845 and continue until 2875

## 2017-08-30 NOTE — Progress Notes
See applicable medications, laboratory test, radiologic test, referrals and ancillary test in the 'ORDERS PLACED THIS ENCOUNTER SECTION'.  ~Reviewed with the patient: Current vitals, Current weight and managment of such, Current, new and OTC meds with risk & benefits, Ancillary test results from previous visit, Healthy Lifestyle (diet,exercise,stress deductions), and Treatment options.  ~Educational material pertinent to current diagnosis was provided orally/written & patient voiced understanding.  ~Patient was informed that they will be contacted with information pertaining to ABNORMAL lab results, Radiology results, Ancillary test results  ~Patient was instructed to contact [facility] [phone]) or the offices of applicable referrals (contact info of referral specialist will be provided at check out desk) if they have not been contacted for an appointment within 7 days (one week).  ~Return to [facility]th [facility] as scheduled or as previously scheduled by PCP  ~Please call [facility] [phone]) or Referral specialist offices if scheduled appointments will need to be canceled  The patient voiced understanding.    Robert Velasquez's Estimated body mass index is 25.1 kg/m? as calculated from the following:    Height as of 06/23/17: 1.676 m (5\' 6" ).    Weight as of 06/23/17: 70.5 kg (155 lb 8 oz).      The health risks associated with an elevated BMI were discussed and education was provided.  See orders for any further follow up plans.    adderall prescriptions not given. Patient unable to leave urine specimen for [facility]. Will give scripts when able to leave urine specimen.     Orders Placed This Encounter   Medications   ? DISCONTD: amphetamine-dextroamphetamine (ADDERALL XR) 30 MG PO Capsule Extended Release 24 Hour     Sig: Take 1 capsule by mouth every morning. Earliest Fill Date: 08/29/17     Dispense:  30 capsule     Refill:  0

## 2017-08-30 NOTE — Progress Notes
User Key  (r) = Recorded By, (t) = Taken By, (c) = Cosigned By    Initials Name Effective Dates    AV Omer JackVialva-Wright, Aleena, APRN 06/15/17 -         Physical Exam   Constitutional: He is oriented to person, place, and time. He appears well-developed and well-nourished.   BMI 25   HENT:   Head: Normocephalic and atraumatic.   Right Ear: External ear normal.   Left Ear: External ear normal.   Nose: Nose normal.   Mouth/Throat: Oropharynx is clear and moist.   Eyes: Pupils are equal, round, and reactive to light. Conjunctivae and EOM are normal.   Neck: Normal range of motion.   Cardiovascular: Normal rate, regular rhythm and normal heart sounds.    Pulmonary/Chest: Effort normal and breath sounds normal.   Abdominal: Soft. Bowel sounds are normal.   Musculoskeletal: Normal range of motion.   Neurological: He is alert and oriented to person, place, and time. He has normal reflexes.   Skin: Skin is warm and dry.   Psychiatric: He has a normal mood and affect. His behavior is normal.   Vitals reviewed.       Assessment:       ICD-10-CM ICD-9-CM    1. Attention deficit hyperactivity disorder (ADHD), combined type F90.2 314.01 amphetamine-dextroamphetamine (ADDERALL XR) 30 MG PO Capsule Extended Release 24 Hour      CBC and Differential      Comprehensive Metabolic Panel      Drug Screen Comprehensive, Urine      CBC and Differential      Comprehensive Metabolic Panel      Drug Screen Comprehensive, Urine      CBC with Differential panel result      Amphetamine Confirmation Urine      Amphetamine Confirmation Urine   2. History of BPH Z87.438 V13.89 finasteride (PROPECIA) 1 MG PO Tablet   3. Encounter for long-term (current) drug use Z79.899 V58.69 Drug Screen Comprehensive, Urine      Drug Screen Comprehensive, Urine      Amphetamine Confirmation Urine      Amphetamine Confirmation Urine   4. BMI 25.0-25.9,adult Z68.25 V85.21    5. Participant in health and wellness plan Z78.9 V49.89           Plan:

## 2017-08-30 NOTE — Patient Instructions
balance. Please be sure to let me know if you fall or are fearful you may fall.    Urinary Leakage:   Urine leakage is common, but it is not a normal part of aging. Talk with me about any urine leakage so that the cause can be found and treated. Treatment can include bladder training, exercises, medicine or surgery.    Pain:   We all have aches and pains at times, but chronic pain can change how you feel and live every day. Please talk with me about any symptoms of chronic pain so that we can determine how best to treat.     Sleep:    Getting a good night?s sleep is vital to your health and well-being and can help prevent or manage health problems.   Often, sleep can be improved by changing behaviors, including when you go to bed and what you do before bed.  Sleep apnea can cause problems such as struggling to stay awake during the day. Please let me know if you would like to learn more about improving your sleep and/or think you may have sleep apnea.     Home Safety:   You may benefit from a safety check to identify hazards in your home.     Seat Belt:   Please remember to wear a seat belt when driving or riding in a vehicle. It is one of the most important things you can do to stay safe in a car.    Nutrition:   Remember to eat plenty of fruits, vegetables, whole grains, and dairy. Drink at least 64 ounces (8 full glasses) of water a day, unless you have been advised to limit fluids.      Alcohol:   Alcohol can have a greater effect on older people, who may feel its effects at a lower amount.  Older people should limit alcoholic drinks (no more than one a day for women and no more than two a day for men). Please let me know if alcohol use becomes a problem.    Tobacco:   Not smoking or using other forms of tobacco is one of the most important things you can do for your health.     Advance Directives:   There may come a time when medical decisions need to be made on your

## 2017-08-30 NOTE — Progress Notes
Order Specific Question:   Prior to prescribing this controlled substance, has the [place] PDMP website been accessed by yourself or designated staff?     Answer:   Yes, The PDMP website has been accessed   ? finasteride (PROPECIA) 1 MG PO Tablet     Sig: Take 1 tablet by mouth daily.     Dispense:  90 tablet     Refill:  3   ? DISCONTD: amphetamine-dextroamphetamine (ADDERALL XR) 30 MG PO Capsule Extended Release 24 Hour     Sig: Take 1 capsule by mouth every morning. Earliest Fill Date: 09/26/17     Dispense:  30 capsule     Refill:  0     Order Specific Question:   Prior to prescribing this controlled substance, has the [place] PDMP website been accessed by yourself or designated staff?     Answer:   Yes, The PDMP website has been accessed   ? amphetamine-dextroamphetamine (ADDERALL XR) 30 MG PO Capsule Extended Release 24 Hour     Sig: Take 1 capsule by mouth every morning. Earliest Fill Date: 10/27/17     Dispense:  30 capsule     Refill:  0     Order Specific Question:   Prior to prescribing this controlled substance, has the [place] PD[place] website been accessed by yourself or designated staff?     Answer:   Yes, The PDMP website has been accessed     Orders Placed This Encounter   Procedures   ? Comprehensive Metabolic Panel   ? Drug Screen Comprehensive, Urine   ? CBC with Differential panel result   ? Amphetamine Confirmation Urine       Health Maintenance was reviewed. The patient's HM Topic list was:                                            Health Maintenance   Topic Date Due   ? DTaP,Tdap,and Td Vaccines (1 - Tdap) 10/23/2017 (Originally 02/18/2009)   ? Preventive Wellness Visit  08/25/2018   ? USPSTF HIV Risk Assessment  Completed   ? Influenza Vaccine  Completed   ? Varicella Vaccine  Excluded

## 2017-08-30 NOTE — Patient Instructions
How Often:  Every 5 years (if normal) To check for high cholesterol, which can increase your risk of heart attack, stroke and other problems. Cholesterol:  Up to date    HDL:  Up to date    LDL:  Overdue    Triglycerides:  Overdue     Colon Cancer Screening    Age: 3950-75; if African-American begin screening at 1945, with colonoscopy recommended    How Often:  Patient should have ONE of the following:  ? Stool test Yearly:    ? Sigmoidoscopy  Every 5 years  ? Colonoscopy  Every 10 years  To detect colon cancer before it has symptoms, so it can be more easily treated. This does not apply to you.     Depression Screening    Age:  5818 and older    How Often: Yearly; As necessary for those with risk factors To identify patients at risk for depression Up to date     Diabetes screening tests    Age:  2818 and older    How Often:  Atleast every 3years.      Medicare covers yearly or 6 month intervals for pre-diabetic patients.  Patient must be diagnosed with ONE of the following:  ? High blood pressure  ? History of abnormal cholesterol/triglyceride  ? Obesity (BMI >30 kg/m2)  ? History of high blood sugar  OR any TWO of the following:  ? Over weight (BMI >21 but <30)  ? Family history of Diabetes (parents, siblings)  ? Age 61+years  ? History of gestational diabetes or birth of baby weighing more than 9 pounds To identify diabetes that may not have symptoms. This does not apply to you.       Prostate cancer screening  (PSA or DRE)    Age:  6250 (beginning the day after 50th birthday) and based on risk factors:  African American men or family history of prostate cancer    How Often:  Covered once every 12 months To detect prostate cancer.  You have not had a PSA level done in the past 3 years.  Discuss with your provider if this screening applies to you.       Consider test for Abdominal Aortic Aneurysm  (AAA)    Age:  365-75 if you have ever smoked or if your parent, brother, sister or child has had an aneurysm.

## 2017-08-30 NOTE — Patient Instructions
PERSONAL PREVENTION PLAN   Thank you for taking time to come in and have your Annual Wellness Visit ? it is important that we have the opportunity to spend additional time with you once a year to discuss not only your preventative care needs, but to review how your care is being managed overall. We hope that you found today?s visit meaningful.     Your Personal Prevention Plan is based on your overall health and your responses to the health questionnaires completed. The following information is for you to review in addition to the recommendations, referrals, and tests we have discussed at your visit.        Things that may be affecting your health:  Alcohol  Weight (BMI >25)      Physical Activity:   Physical activity can help you maintain a healthy weight, prevent or control illness, reduce stress, and sleep better. It can also help you improve your balance to avoid falls. Try to build up to and maintain a total of 30 minutes of activity each day. If you are able, try walking, doing yard or housework, and taking the stairs more often. You can also strengthen your muscles with exercises done while sitting or lying down.     Emotional Health:   Feeling ?down in the dumps? or anxious every now and then is a natural part of life. If this feeling lasts for a few weeks or more, talk with me as soon as possible. It could be a sign of a problem that needs treatment. There are many types of treatment available.      Falls:   You can reduce your risk of falling by making changes in your home. Remove items that may cause tripping, improve lighting, and consider installing grab bars.  Talk with me if you have problems with balance and walking. To prevent falls, you may need your vision, hearing, or blood pressure checked. Exercises to improve your strength and balance, or using a cane or walker, may help.  Review your medicines with me at every visit, because some can affect

## 2017-08-30 NOTE — Progress Notes
Subjective:   Robert Velasquez is a 27 y.o. male being seen today for Medications Refill and ADHD       HPI     Patient returns to the office today for folllow up and management of the current medical diagnosis listed below. Current diagnosis and medications were reviewed with patient. Reports taking all current meds as prescribed and is following dietary recommendations.   Today Patient presents for Medications Refill and ADHD      History reviewed. No pertinent past medical history.  History reviewed. No pertinent surgical history.  History reviewed. No pertinent family history.  Social History     Social History   ? Marital status: Unknown     Spouse name: N/A   ? Number of children: N/A   ? Years of education: N/A     Occupational History   ? Not on file.     Social History Main Topics   ? Smoking status: Never Smoker   ? Smokeless tobacco: Never Used   ? Alcohol use Yes      Comment: occassionally    ? Drug use: Unknown   ? Sexual activity: Yes     Partners: Female     Other Topics Concern   ? Not on file     Social History Narrative   ? No narrative on file     No current outpatient prescriptions on file prior to visit.     No current facility-administered medications on file prior to visit.      No Known Allergies      Review of Systems  Review of Systems   Constitutional:        Over weight    HENT: Negative.    Eyes: Negative.    Respiratory: Negative.    Cardiovascular: Negative.    Gastrointestinal: Negative.    Endocrine: Negative.    Genitourinary: Negative.    Musculoskeletal: Negative.    Skin: Negative.    Allergic/Immunologic: Negative.    Neurological: Negative.    Hematological: Negative.    Psychiatric/Behavioral: Positive for agitation. The patient is nervous/anxious.            Objective:        VITAL SIGNS (all recorded)      Clinic Vitals     Row Name 08/25/17 1428                Amb Encounter Vitals    BP 138/82    -AV at 08/25/17 1428

## 2017-08-30 NOTE — Patient Instructions
How Often:  Once Check for the bulging or ballooning of a large artery, which can lead to complications. This does not apply to you.     Consider Hepatitis C Virus Screening     Age:   If born between 651945-1966 OR         18 years and older based on risk factors    How Often:  Once OR based on risk factors   To detect hepatitis C infection that may have no symptoms, to prevent complications. This does not apply to you.     Consider sexually transmitted disease testing    Age:  Consider if sexually active    How Often:   Gonorrhea/Chlamydia-Based on risk factors                           HIV-at least once, continue if risk factors  To detect infections that may not have symptoms, to prevent complications Discuss with your provider if at risk   Consider Alcohol misuse screening    Age:  Based on risk factors    How Often:  Based on risk factors To aid patient with alcohol related issues. Discuss with your provider if at risk   Consider Smoking Cessation Counseling    Age:  Patient must be a smoker    How Often:  Based on risk factors To aid patient with quitting smoking This does not apply to you.       Immunizations you have received:    Immunization History   Administered Date(s) Administered   ? Flu Vaccine (0.5 mL), 6 mo and older, FLUARIX/FLULAVAL, Pre-filled syringe, IM 06/23/2017       Recommended Immunizations Why   Influenza Vaccine  18+ years (Flu) Yearly  Shingles (Zoster) 60+years  Once  Tetanus  18+years  Every 10 years  Pneumococcal     Pneumonia: 1-2 doses age 50+    Prevnar: 1 dose age 50+    Consider Hepatitis B  Based on risk factors Influenza Vaccine  Protects against flu virus and its complications  Shingles (Zoster) Protects against shingles  Tetanus Protects against tetanus infection  Pneumococcal Protects against common types of pneumonia       Hepatitis B Protects against hepatitis, which can cause illness and complications

## 2018-09-09 DIAGNOSIS — Z87438 Personal history of other diseases of male genital organs: Principal | ICD-10-CM

## 2018-09-09 MED ORDER — FINASTERIDE 1 MG PO TABS
1 mg | Freq: Every day | ORAL | 3 refills | Status: CP
Start: 2018-09-09 — End: ?

## 2018-11-23 DIAGNOSIS — Z021 Encounter for pre-employment examination: Principal | ICD-10-CM

## 2019-03-23 ENCOUNTER — Telehealth: Admit: 2019-03-23 | Payer: PRIVATE HEALTH INSURANCE | Attending: Gerontology | Primary: Gerontology

## 2019-03-23 NOTE — Progress Notes (Signed)
 Chief Complaint   Patient presents with   . New Patient   . Medication Evaluation     Adderall- does not see psychiatry         1. Have you been to the ER, urgent care clinic since your last visit?  Hospitalized since your last visit? NO    2. Have you seen or consulted any other health care providers outside of the [facility] since your last visit?  Include any pap smears or colon screening. NO

## 2019-03-23 NOTE — Progress Notes (Signed)
This encounter was created in error - please disregard.  Patient wanted a provider who would prescribe him adderall. Informed him he would need to see a psychiatrist and I would be happy to take care of the rest of his needs. He has decided to seek out another provider.

## 2019-03-23 NOTE — Progress Notes (Signed)
Chief Complaint   Patient presents with   ??? New Patient   ??? Medication Evaluation     Adderall- does not see psychiatry         1. Have you been to the ER, urgent care clinic since your last visit?  Hospitalized since your last visit? NO    2. Have you seen or consulted any other health care providers outside of the [facility] since your last visit?  Include any pap smears or colon screening. NO

## 2019-03-23 NOTE — Progress Notes (Signed)
This encounter was created in error - please disregard.  Patient wanted a provider who would prescribe him adderall. Informed him he would need to see a psychiatrist and I would be happy to take care of the rest of his needs. He has decided to seek out another provider.

## 2020-01-19 ENCOUNTER — Inpatient Hospital Stay
Admit: 2020-01-19 | Discharge: 2020-01-20 | Disposition: A | Discharge status: Home / Self Care | Payer: BLUE CROSS/BLUE SHIELD | Attending: Emergency Medicine

## 2020-01-19 DIAGNOSIS — S60562A Insect bite (nonvenomous) of left hand, initial encounter: Secondary | ICD-10-CM

## 2020-01-19 MED ORDER — CEFTRIAXONE 1 GRAM SOLUTION FOR INJECTION
1 gram | INTRAMUSCULAR | Status: DC
Start: 2020-01-19 — End: 2020-01-19

## 2020-01-19 MED ORDER — SODIUM CHLORIDE 0.9 % IV
1000 mg | Freq: Once | INTRAVENOUS | Status: DC
Start: 2020-01-19 — End: 2020-01-19

## 2020-01-19 MED ORDER — METHYLPREDNISOLONE (PF) 125 MG/2 ML IJ SOLR
125 mg/2 mL | INTRAMUSCULAR | Status: DC
Start: 2020-01-19 — End: 2020-01-19

## 2020-01-19 MED ORDER — DIPHENHYDRAMINE HCL 50 MG/ML IJ SOLN
50 mg/mL | Freq: Once | INTRAMUSCULAR | Status: DC
Start: 2020-01-19 — End: 2020-01-19

## 2020-01-19 MED ORDER — VANCOMYCIN IN 0.9 % SODIUM CHLORIDE 1.75 GRAM/500 ML IV
1.75 gram/500 mL | Freq: Once | INTRAVENOUS | Status: DC
Start: 2020-01-19 — End: 2020-01-19

## 2020-01-19 MED ORDER — DIPHTH,PERTUS(AC)TETANUS VAC(PF) 2.5 LF UNIT-8 MCG-5 LF/0.5 ML INJ
INTRAMUSCULAR | Status: AC
Start: 2020-01-19 — End: 2020-01-19
  Administered 2020-01-20: 03:00:00 via INTRAMUSCULAR

## 2020-01-19 MED ORDER — PHARMACY VANCOMYCIN NOTE
Status: DC
Start: 2020-01-19 — End: 2020-01-19

## 2020-01-19 MED FILL — PHARMACY VANCOMYCIN NOTE: Qty: 1

## 2020-01-19 NOTE — ED Notes (Signed)
Patient arrived to triage presenting with a bee sting to the left hand from 2 days. Patient went to urgent care today and only rec'd antibiotics.

## 2020-01-19 NOTE — ED Provider Notes (Signed)
ED Provider Notes by Karen KitchensGalyo, Nemiah Bubar L, PA at 01/19/20 1936                Author: Karen KitchensGalyo, Patricio Popwell L, PA  Service: EMERGENCY  Author Type: Physician Assistant       Filed: 01/19/20 2108  Date of Service: 01/19/20 1936  Status: Attested           Editor: Karen KitchensGalyo, Letzy Gullickson L, PA (Physician Assistant)  Cosigner: Deborra MedinaBeaudette, Charles I, MD at 01/27/20 1052          Attestation signed by Deborra MedinaBeaudette, Charles I, MD at 01/27/20 1052          I was personally available for consultation in the emergency department.  I have reviewed the chart and agree with the documentation recorded by the [facility], including  the assessment, treatment plan, and disposition.   Deborra Medinaharles I Beaudette, MD                                 EMERGENCY DEPARTMENT HISTORY AND PHYSICAL EXAM      Date: 01/19/2020   Patient Name: Carl Sullivan        History of Presenting Illness          Chief Complaint       Patient presents with        ?  Insect Bite        ?  Hand Swelling              History Provided By: Patient         Additional History (Context): Carl MedalChristopher Nelms  is a 29 y.o. male  with No significant past medical history who presents with swelling to his left  dorsal hand redness and streaking past mid forearm after he was stung by a wasp yesterday.  Went to an urgent care and was started on Keflex 500 3 times daily.  Right-hand-dominant.  Instructed to come to the emergency department for any worsening.  He  feels like it pain is worse.      PCP: Esperanza SheetsHowell, Christine L, NP        Current Facility-Administered Medications             Medication  Dose  Route  Frequency  Provider  Last Rate  Last Admin              ?  diph,Pertuss(AC),Tet Vac-PF (BOOSTRIX) suspension 0.5 mL   0.5 mL  IntraMUSCular  PRIOR TO DISCHARGE  Lenvil Swaim L, PA           ?  cefTRIAXone (ROCEPHIN) 1 g in sterile water (preservative free) 10 mL IV syringe   1 g  IntraVENous  Q24H  Lucyann Romano L, PA           ?  diphenhydrAMINE (BENADRYL) injection 50 mg   50 mg  IntraVENous  ONCE   Zell Doucette L, PA           ?  methylPREDNISolone (PF) (Solu-MEDROL) injection 125 mg   125 mg  IntraVENous  NOW  Jibran Crookshanks L, PA                    ?  vancomycin (VANCOCIN) 1750 mg in NS 500 ml infusion   1,750 mg  IntraVENous  ONCE  Omir Cooprider L, PA                    ?  VANCOMYCIN pharmacy to dose     Other  Rx Dosing/Monitoring  Waldo Laine L, PA                Current Outpatient Medications          Medication  Sig  Dispense  Refill           ?  amphetamine-dextroamphetamine XR (ADDERALL XR) 30 mg XR capsule  Take 30 mg by mouth Every morning.         ?  buPROPion SR (WELLBUTRIN SR) 150 mg SR tablet  Take 150 mg by mouth daily.         ?  finasteride (PROPECIA) 1 mg tablet  Take 1 mg by mouth daily.               ?  methylphenidate ER 54 mg 24 hr tab  Take 54 mg by mouth Every morning.                 Past History        Past Medical History:     Past Medical History:        Diagnosis  Date         ?  ADHD       ?  Anxiety       ?  Depression           ?  Hair loss             Past Surgical History:   No past surgical history on file.      Family History:     Family History         Problem  Relation  Age of Onset          ?  Psychiatric Disorder  Mother       ?  No Known Problems  Father       ?  Psychiatric Disorder  Sister       ?  Attention Deficit Hyperactivity Disorder  Sister       ?  Psychiatric Disorder  Brother       ?  Attention Deficit Hyperactivity Disorder  Brother            ?  Ulcerative Colitis  Brother             Social History:     Social History          Tobacco Use         ?  Smoking status:  Never Smoker       Substance Use Topics         ?  Alcohol use:  Yes             Comment: 6-8 weekly          ?  Drug use:  Not Currently           Allergies:   No Known Allergies           Review of Systems     Review of Systems    Musculoskeletal: Positive for arthralgias and joint swelling .    Skin: Positive for color change.       All Other Systems Negative     Physical Exam          Vitals:           01/19/20 1901        BP:  (!) 126/94  Pulse:  (!) 102     Resp:  18     Temp:  98.4 ??F (36.9 ??C)     SpO2:  100%     Weight:  78.5 kg (173 lb)        Height:  5\' 6"  (1.676 m)        Physical Exam   Vitals and nursing note reviewed.   Constitutional:        General: He is not in acute distress.     Appearance: He is well-developed. He is not ill-appearing, toxic-appearing or diaphoretic.    HENT:       Head: Normocephalic and atraumatic.   Neck:       Thyroid: No thyromegaly.      Vascular: No carotid bruit.      Trachea: No tracheal deviation.    Cardiovascular:       Rate and Rhythm: Normal rate and regular rhythm.      Heart sounds: Normal heart sounds. No murmur heard.   No friction rub. No gallop.    Pulmonary:       Effort: Pulmonary effort is normal. No respiratory distress.      Breath sounds: Normal breath sounds. No stridor. No wheezing or  rales.   Chest :       Chest wall: No tenderness.   Abdominal :      General: There is no distension.      Palpations: Abdomen is soft. There is no mass.      Tenderness: There is no abdominal tenderness. There is no guarding or rebound.     Musculoskeletal:          General: Swelling and tenderness  present. Normal range of motion.      Cervical back: Normal range of motion and neck supple.      Comments: Left dorsal hand swelling, redness, warmth extending distally to mid-proximal  phalanges 2-5 and proximally to the proximal third of dorsal forearm.  Radial pulse palpable.  No open lesions. FROM of all digits and wrist.    Skin:      General: Skin is warm and dry.      Coloration: Skin is not pale.      Findings: Erythema present.   Neurological:       Mental Status: He is alert.   Psychiatric :         Speech: Speech normal.         Behavior: Behavior normal.         Thought Content: Thought content normal.         Judgment: Judgment normal.                   Diagnostic Study Results        Labs -    No results found for this or any previous visit (from  the past 12 hour(s)).      Radiologic Studies -      No orders to display          CT Results   (Last 48 hours)          None                 CXR Results   (Last 48 hours)          None                       Medical Decision Making  I am the first provider for this patient.      I reviewed the vital signs, available nursing notes, past medical history, past surgical history, family history and social history.      Vital Signs-Reviewed the patient's vital signs.      Records Reviewed: Nursing Notes      Procedures:   Procedures      Provider Notes (Medical Decision Making): get cultures, POC lactic, give IV ABX, re-assess.  Give also benadryl,  steroid.      9:07 PM : Pt care transferred to NP Mato,ED provider. History of patient complaint(s), available diagnostic reports and current treatment plan has been discussed thoroughly.    Bedside rounding on patient occured : yes .   Intended disposition of patient : home   Pending diagnostics reports and/or labs (please list): labs, med,s reassessment      MED RECONCILIATION:     Current Facility-Administered Medications          Medication  Dose  Route  Frequency           ?  diph,Pertuss(AC),Tet Vac-PF (BOOSTRIX) suspension 0.5 mL   0.5 mL  IntraMUSCular  PRIOR TO DISCHARGE     ?  cefTRIAXone (ROCEPHIN) 1 g in sterile water (preservative free) 10 mL IV syringe   1 g  IntraVENous  Q24H     ?  diphenhydrAMINE (BENADRYL) injection 50 mg   50 mg  IntraVENous  ONCE     ?  methylPREDNISolone (PF) (Solu-MEDROL) injection 125 mg   125 mg  IntraVENous  NOW     ?  vancomycin (VANCOCIN) 1750 mg in NS 500 ml infusion   1,750 mg  IntraVENous  ONCE           ?  VANCOMYCIN pharmacy to dose     Other  Rx Dosing/Monitoring          Current Outpatient Medications        Medication  Sig         ?  amphetamine-dextroamphetamine XR (ADDERALL XR) 30 mg XR capsule  Take 30 mg by mouth Every morning.     ?  buPROPion SR (WELLBUTRIN SR) 150 mg SR tablet  Take 150 mg by mouth daily.     ?   finasteride (PROPECIA) 1 mg tablet  Take 1 mg by mouth daily.         ?  methylphenidate ER 54 mg 24 hr tab  Take 54 mg by mouth Every morning.           Disposition:   TBD        Follow-up Information      None                  Current Discharge Medication List                       Diagnosis        Clinical Impression:       1.  Insect bite of hand with infection, initial encounter

## 2020-01-19 NOTE — ED Notes (Signed)
20 mg prednisone wasted.  Documented in pyxis.  New order to be placed for remaining prednisone needed.

## 2020-01-19 NOTE — ED Notes (Signed)
ED Notes by  Annell Greening, NP at 01/19/20 2332                Author: Annell Greening, NP  Service: Emergency Medicine  Author Type: Nurse Practitioner       Filed: 01/19/20 2341  Date of Service: 01/19/20 2332  Status: Attested           Editor: Annell Greening, NP (Nurse Practitioner)  Cosigner: Dan Maker, DO at 01/20/20 [phone]          Attestation signed by Dan Maker, DO at 01/20/20 [phone]          I was personally available for consultation in the emergency department.  I have reviewed the chart prior to disposition and discussed with MLP.  I agree with  the documentation recorded by the [facility], including the assessment, treatment plan, and disposition.   Dan Maker, DO                                       Assumed care of patient from [facility]. Pending labs.  Patient reports he was stung by a 2 days ago.  Patient reports his left hand has been swollen since then.  Erythema  extends to the wrist.  Rash is blanchable.  Patient has palpable radial pulses.  Intact range of motion of the hand.  Patient went to urgent care today and was given Keflex.  He has taken 3 pills.  Hand still swollen.  Patient tried Benadryl when this  first happened..  Patient evaluated.  Patient was nontoxic looking.  I suspect patient swelling of his hand is due to allergic reaction to the insect bite.  I do not suspect sepsis at this time.  I do not believe patient needs IV antibiotics.  Patient  does have an elevated white count.  Patient liver enzyme elevated.  Patient was made aware of this finding.  Patient advised to follow-up with his PCP in regards to his liver enzymes.  He has no abdominal pain. Dr. Loletta Specter was available to assess patient  hand as well and agrees with plan to give prednisone and pepcid and DC home. Pt to continue Keflex.  Patient to return in 2 days for wound recheck or sooner if infection seems to be spreading.   Abnormal lab results from this emergency department encounter:     Labs  Reviewed       CBC WITH AUTOMATED DIFF - Abnormal; Notable for the following components:            Result  Value            HGB  16.3 (*)            All other components within normal limits       METABOLIC PANEL, COMPREHENSIVE - Abnormal; Notable for the following components:            ALT (SGPT)  150 (*)         AST (SGOT)  43 (*)         Protein, total  8.3 (*)         Globulin  4.3 (*)            All other components within normal limits       METABOLIC PANEL, BASIC           Lab  values for this patient within approximately the last 12 hours:     Recent Results (from the past 12 hour(s))     CBC WITH AUTOMATED DIFF          Collection Time: 01/19/20 10:15 PM         Result  Value  Ref Range            WBC  9.4  4.6 - 13.2 K/uL       RBC  5.04  4.35 - 5.65 M/uL       HGB  16.3 (H)  13.0 - 16.0 g/dL       HCT  46.5  36.0 - 48.0 %       MCV  92.3  74.0 - 97.0 FL            MCH  32.3  24.0 - 34.0 PG            MCHC  35.1  31.0 - 37.0 g/dL       RDW  12.1  11.6 - 14.5 %       PLATELET  312  135 - 420 K/uL       MPV  9.2  9.2 - 11.8 FL       NEUTROPHILS  69  40 - 73 %       LYMPHOCYTES  21  21 - 52 %       MONOCYTES  7  3 - 10 %       EOSINOPHILS  3  0 - 5 %       BASOPHILS  0  0 - 2 %       ABS. NEUTROPHILS  6.4  1.8 - 8.0 K/UL       ABS. LYMPHOCYTES  1.9  0.9 - 3.6 K/UL       ABS. MONOCYTES  0.6  0.05 - 1.2 K/UL       ABS. EOSINOPHILS  0.3  0.0 - 0.4 K/UL       ABS. BASOPHILS  0.0  0.0 - 0.1 K/UL       DF  AUTOMATED          METABOLIC PANEL, COMPREHENSIVE          Collection Time: 01/19/20 10:15 PM         Result  Value  Ref Range            Sodium  138  136 - 145 mmol/L       Potassium  3.8  3.5 - 5.5 mmol/L       Chloride  106  100 - 111 mmol/L       CO2  29  21 - 32 mmol/L       Anion gap  3  3.0 - 18 mmol/L       Glucose  90  74 - 99 mg/dL       BUN  12  7.0 - 18 MG/DL       Creatinine  0.92  0.6 - 1.3 MG/DL       BUN/Creatinine ratio  13  12 - 20         GFR est AA  >60  >60 ml/min/1.32m       GFR est non-AA   >60  >60 ml/min/1.739m      Calcium  9.1  8.5 - 10.1 MG/DL       Bilirubin, total  0.6  0.2 - 1.0 MG/DL  ALT (SGPT)  150 (H)  16 - 61 U/L            AST (SGOT)  43 (H)  10 - 38 U/L            Alk. phosphatase  115  45 - 117 U/L       Protein, total  8.3 (H)  6.4 - 8.2 g/dL       Albumin  4.0  3.4 - 5.0 g/dL       Globulin  4.3 (H)  2.0 - 4.0 g/dL            A-G Ratio  0.9  0.8 - 1.7             Radiologist and cardiologist interpretations if available at time of this note:   No results found.      Medication(s) ordered for patient during this emergency visit encounter:     Medications       predniSONE (DELTASONE) tablet 20 mg (has no administration in time range)     diph,Pertuss(AC),Tet Vac-PF (BOOSTRIX) suspension 0.5 mL (0.5 mL IntraMUSCular Given 01/19/20 2305)     predniSONE (DELTASONE) tablet 60 mg (60 mg Oral Given 01/19/20 2305)       famotidine (PEPCID) tablet 20 mg (20 mg Oral Given 01/19/20 2305)

## 2020-01-19 NOTE — ED Notes (Signed)
I have reviewed discharge instructions with the patient. The patient verbalized understanding. Patient looks comfortable, left ED in stable condition. Patient ambulatory, steady gait noted. Vital signs stable upon discharge. No acute distress noted, no new complaints noted at this time.

## 2020-01-20 LAB — COMPREHENSIVE METABOLIC PANEL
ALT: 150 U/L — ABNORMAL HIGH (ref 16–61)
AST: 43 U/L — ABNORMAL HIGH (ref 10–38)
Albumin/Globulin Ratio: 0.9 (ref 0.8–1.7)
Albumin: 4 g/dL (ref 3.4–5.0)
Alkaline Phosphatase: 115 U/L (ref 45–117)
Anion Gap: 3 mmol/L (ref 3.0–18)
BUN: 12 MG/DL (ref 7.0–18)
Bun/Cre Ratio: 13 (ref 12–20)
CO2: 29 mmol/L (ref 21–32)
Calcium: 9.1 MG/DL (ref 8.5–10.1)
Chloride: 106 mmol/L (ref 100–111)
Creatinine: 0.92 MG/DL (ref 0.6–1.3)
EGFR IF NonAfrican American: >60 mL/min/{1.73_m2} (ref >60)
GFR African American: >60 mL/min/{1.73_m2} (ref >60)
Globulin: 4.3 g/dL — ABNORMAL HIGH (ref 2.0–4.0)
Glucose: 90 mg/dL (ref 74–99)
Potassium: 3.8 mmol/L (ref 3.5–5.5)
Sodium: 138 mmol/L (ref 136–145)
Total Bilirubin: 0.6 MG/DL (ref 0.2–1.0)
Total Protein: 8.3 g/dL — ABNORMAL HIGH (ref 6.4–8.2)

## 2020-01-20 LAB — CBC WITH AUTO DIFFERENTIAL
Basophils %: 0 % (ref 0–2)
Basophils Absolute: 0 10*3/uL (ref 0.0–0.1)
Eosinophils %: 3 % (ref 0–5)
Eosinophils Absolute: 0.3 10*3/uL (ref 0.0–0.4)
Hematocrit: 46.5 % (ref 36.0–48.0)
Hemoglobin: 16.3 g/dL — ABNORMAL HIGH (ref 13.0–16.0)
Lymphocytes %: 21 % (ref 21–52)
Lymphocytes Absolute: 1.9 10*3/uL (ref 0.9–3.6)
MCH: 32.3 PG (ref 24.0–34.0)
MCHC: 35.1 g/dL (ref 31.0–37.0)
MCV: 92.3 FL (ref 74.0–97.0)
MPV: 9.2 FL (ref 9.2–11.8)
Monocytes %: 7 % (ref 3–10)
Monocytes Absolute: 0.6 10*3/uL (ref 0.05–1.2)
Neutrophils %: 69 % (ref 40–73)
Neutrophils Absolute: 6.4 10*3/uL (ref 1.8–8.0)
Platelets: 312 10*3/uL (ref 135–420)
RBC: 5.04 M/uL (ref 4.35–5.65)
RDW: 12.1 % (ref 11.6–14.5)
WBC: 9.4 10*3/uL (ref 4.6–13.2)

## 2020-01-20 LAB — CBC WITH AUTOMATED DIFF
ABS. BASOPHILS: 0 10*3/uL (ref 0.0–0.1)
ABS. EOSINOPHILS: 0.3 10*3/uL (ref 0.0–0.4)
ABS. LYMPHOCYTES: 1.9 10*3/uL (ref 0.9–3.6)
ABS. MONOCYTES: 0.6 10*3/uL (ref 0.05–1.2)
ABS. NEUTROPHILS: 6.4 10*3/uL (ref 1.8–8.0)
BASOPHILS: 0 % (ref 0–2)
EOSINOPHILS: 3 % (ref 0–5)
HCT: 46.5 % (ref 36.0–48.0)
HGB: 16.3 g/dL — ABNORMAL HIGH (ref 13.0–16.0)
LYMPHOCYTES: 21 % (ref 21–52)
MCH: 32.3 PG (ref 24.0–34.0)
MCHC: 35.1 g/dL (ref 31.0–37.0)
MCV: 92.3 FL (ref 74.0–97.0)
MONOCYTES: 7 % (ref 3–10)
MPV: 9.2 FL (ref 9.2–11.8)
NEUTROPHILS: 69 % (ref 40–73)
PLATELET: 312 10*3/uL (ref 135–420)
RBC: 5.04 M/uL (ref 4.35–5.65)
RDW: 12.1 % (ref 11.6–14.5)
WBC: 9.4 10*3/uL (ref 4.6–13.2)

## 2020-01-20 LAB — METABOLIC PANEL, COMPREHENSIVE
A-G Ratio: 0.9 (ref 0.8–1.7)
ALT (SGPT): 150 U/L — ABNORMAL HIGH (ref 16–61)
AST (SGOT): 43 U/L — ABNORMAL HIGH (ref 10–38)
Albumin: 4 g/dL (ref 3.4–5.0)
Alk. phosphatase: 115 U/L (ref 45–117)
Anion gap: 3 mmol/L (ref 3.0–18)
BUN/Creatinine ratio: 13 (ref 12–20)
BUN: 12 MG/DL (ref 7.0–18)
Bilirubin, total: 0.6 MG/DL (ref 0.2–1.0)
CO2: 29 mmol/L (ref 21–32)
Calcium: 9.1 MG/DL (ref 8.5–10.1)
Chloride: 106 mmol/L (ref 100–111)
Creatinine: 0.92 MG/DL (ref 0.6–1.3)
GFR est AA: >60 mL/min/{1.73_m2} (ref >60)
GFR est non-AA: >60 mL/min/{1.73_m2} (ref >60)
Globulin: 4.3 g/dL — ABNORMAL HIGH (ref 2.0–4.0)
Glucose: 90 mg/dL (ref 74–99)
Potassium: 3.8 mmol/L (ref 3.5–5.5)
Protein, total: 8.3 g/dL — ABNORMAL HIGH (ref 6.4–8.2)
Sodium: 138 mmol/L (ref 136–145)

## 2020-01-20 MED ORDER — PREDNISONE 20 MG TAB
20 mg | ORAL | Status: AC
Start: 2020-01-20 — End: 2020-01-19
  Administered 2020-01-20: 03:00:00 via ORAL

## 2020-01-20 MED ORDER — PREDNISONE 5 MG TABLETS IN A DOSE PACK
5 mg | ORAL_TABLET | ORAL | 0 refills | Status: DC
Start: 2020-01-20 — End: 2020-01-19

## 2020-01-20 MED ORDER — PREDNISONE 5 MG TABLETS IN A DOSE PACK
5 mg | ORAL_TABLET | ORAL | 0 refills | Status: DC
Start: 2020-01-20 — End: 2020-07-29

## 2020-01-20 MED ORDER — FAMOTIDINE 20 MG TAB
20 mg | ORAL | Status: AC
Start: 2020-01-20 — End: 2020-01-19
  Administered 2020-01-20: 03:00:00 via ORAL

## 2020-01-20 MED ORDER — FAMOTIDINE 20 MG TAB
20 mg | ORAL_TABLET | Freq: Two times a day (BID) | ORAL | 0 refills | Status: AC
Start: 2020-01-20 — End: 2020-01-29

## 2020-01-20 MED ORDER — FAMOTIDINE 20 MG TAB
20 mg | ORAL_TABLET | Freq: Two times a day (BID) | ORAL | 0 refills | Status: DC
Start: 2020-01-20 — End: 2020-01-19

## 2020-01-20 MED ORDER — PREDNISONE 20 MG TAB
20 mg | ORAL | Status: DC
Start: 2020-01-20 — End: 2020-01-20

## 2020-01-20 MED FILL — BOOSTRIX TDAP 2.5 LF UNIT-8 MCG-5 LF/0.5 ML INTRAMUSCULAR SYRINGE: INTRAMUSCULAR | Qty: 0.5

## 2020-01-20 MED FILL — PREDNISONE 20 MG TAB: 20 mg | ORAL | Qty: 1

## 2020-01-20 MED FILL — FAMOTIDINE 20 MG TAB: 20 mg | ORAL | Qty: 1

## 2020-01-20 MED FILL — PREDNISONE 20 MG TAB: 20 mg | ORAL | Qty: 3

## 2020-07-01 ENCOUNTER — Encounter: Attending: Physician Assistant | Primary: Gerontology

## 2020-07-29 ENCOUNTER — Ambulatory Visit
Admit: 2020-07-29 | Discharge: 2020-07-29 | Payer: BLUE CROSS/BLUE SHIELD | Attending: Internal Medicine | Primary: Gerontology

## 2020-07-29 ENCOUNTER — Ambulatory Visit: Attending: Internal Medicine | Primary: Gerontology

## 2020-07-29 DIAGNOSIS — F909 Attention-deficit hyperactivity disorder, unspecified type: Secondary | ICD-10-CM

## 2020-07-29 NOTE — Progress Notes (Signed)
 Chief Complaint   Patient presents with    Establish Care     1. Have you been to the ER, urgent care clinic since your last visit?  Hospitalized since your last visit?No    2. Have you seen or consulted any other health care providers outside of the [facility] since your last visit?  Include any pap smears or colon screening. No

## 2020-07-29 NOTE — Progress Notes (Signed)
Office Visit Note:    Assessment/Plan:  1. Attention deficit hyperactivity disorder (ADHD), unspecified ADHD type    2. Cellulitis and abscess of hand    3. Alopecia    4. Healthcare maintenance        1. ADHD-he states that he has been diagnosed with ADHD and is getting Adderall from a doctor in [facility].  He also tells me that he has a lot of anxiety and has situational depression.  He was on Wellbutrin in the past which is not taking anymore.  I told him that he needs to be seen by psychiatrist to weigh the risks and benefits of taking Adderall.  I will refer him to a psychiatrist now.  2. Cellulitis of his hand-improving, no more streaking.  On Keflex, advised him to continue the course of Keflex  3. Alopecia-all Propecia, appears stable    Health Maintenance:  Immunizations:  Tetanus: Last in July 2021  Influenza: got it this season  Covid: 3 doses of pfizer    Screening:  Hepatitis C: Deferred  HIV:  Deferred   diabetes: Deferred  Plan to check lipid panel with next set of labs      Orders Placed This Encounter   ??? REFERRAL TO PSYCHIATRY     Referral Priority:   Routine     Referral Type:   Behavioral Health     Referral Reason:   Specialty Services Required     Referred to Provider:   Tillie Rung     Requested Specialty:   Psychiatry     Number of Visits Requested:   1   ??? cephALEXin (KEFLEX) 500 mg capsule     Sig: TAKE 1 CAPSULE BY MOUTH EVERY 6 HOURS FOR 10 DAYS   ??? hydrOXYzine HCL (ATARAX) 25 mg tablet     Sig: TAKE 1 TABLET BY MOUTH THREE TIMES DAILY FOR 5 DAYS AS NEEDED     Social Determinants of Health     Tobacco Use: Low Risk    ??? Smoking Tobacco Use: Never Smoker   ??? Smokeless Tobacco Use: Never Used   Alcohol Use:    ??? Frequency of Alcohol Consumption: Not on file   ??? Average Number of Drinks: Not on file   ??? Frequency of Binge Drinking: Not on file   Financial Resource Strain:    ??? Difficulty of Paying Living Expenses: Not on file   Food Insecurity:    ??? Worried About Running Out of  Food in the Last Year: Not on file   ??? Ran Out of Food in the Last Year: Not on file   Transportation Needs:    ??? Lack of Transportation (Medical): Not on file   ??? Lack of Transportation (Non-Medical): Not on file   Physical Activity:    ??? Days of Exercise per Week: Not on file   ??? Minutes of Exercise per Session: Not on file   Stress:    ??? Feeling of Stress : Not on file   Social Connections:    ??? Frequency of Communication with Friends and Family: Not on file   ??? Frequency of Social Gatherings with Friends and Family: Not on file   ??? Attends Religious Services: Not on file   ??? Active Member of Clubs or Organizations: Not on file   ??? Attends Archivist Meetings: Not on file   ??? Marital Status: Not on file   Intimate Partner Violence:    ??? Fear of Current or  Ex-Partner: Not on file   ??? Emotionally Abused: Not on file   ??? Physically Abused: Not on file   ??? Sexually Abused: Not on file   Depression:    ??? PHQ-2 Score: Not on file   Housing Stability:    ??? Unable to Pay for Housing in the Last Year: Not on file   ??? Number of Places Lived in the Last Year: Not on file   ??? Unstable Housing in the Last Year: Not on file       Follow-up and Dispositions    ?? Return in about 3 months (around 10/27/2020).         I have reviewed with the patient details of the assessment and plan and all questions were answered. Relevant patient education was performed. The most recent lab findings were reviewed with the patient.  An After Visit Summary was printed and given to the patient.    Reason for Visit: Establish Care      Subjective:  30 y.o. male with h/o ADHD, alopecia who comes to establish with me as a primary care physician.  He states that he wants me to prescribe his Adderall that is why he is here.  He had a recent visit to the ED for cellulitis of his hands due to some insect bite which he states is improving.  He denies any other complaints to me.  No headaches no blurred vision no chest pain or shortness of breath  no abdominal pain no nausea no vomiting no diarrhea constipation.    Review of Systems  A complete 11 system ROS was preformed (constitutional, eyes, ENT, cardiovascular, respiratory, gastrointestinal, genitourinary, musculoskeletal, skin, neurological, psychiatric) and was negative aside from the pertinent positives and negatives noted in the HPI.    Past Medical History:   Diagnosis Date   ??? ADHD    ??? Anxiety    ??? Depression    ??? Hair loss      History reviewed. No pertinent surgical history.  Social History     Socioeconomic History   ??? Marital status: SINGLE   Tobacco Use   ??? Smoking status: Never Smoker   ??? Smokeless tobacco: Never Used   Substance and Sexual Activity   ??? Alcohol use: Yes     Comment: 6-8 weekly    ??? Drug use: Not Currently   ??? Sexual activity: Yes     Family History   Problem Relation Age of Onset   ??? Psychiatric Disorder Mother    ??? No Known Problems Father    ??? Psychiatric Disorder Sister    ??? Attention Deficit Hyperactivity Disorder Sister    ??? Psychiatric Disorder Brother    ??? Attention Deficit Hyperactivity Disorder Brother    ??? Ulcerative Colitis Brother      Current Outpatient Medications   Medication Sig Dispense Refill   ??? cephALEXin (KEFLEX) 500 mg capsule TAKE 1 CAPSULE BY MOUTH EVERY 6 HOURS FOR 10 DAYS     ??? hydrOXYzine HCL (ATARAX) 25 mg tablet TAKE 1 TABLET BY MOUTH THREE TIMES DAILY FOR 5 DAYS AS NEEDED     ??? amphetamine-dextroamphetamine XR (ADDERALL XR) 30 mg XR capsule Take 30 mg by mouth Every morning.     ??? finasteride (PROPECIA) 1 mg tablet Take 1 mg by mouth daily.       Allergies   Allergen Reactions   ??? Venom-Wasp Hives       Objective:  Visit Vitals  BP 126/76 (BP 1  Location: Left upper arm, BP Patient Position: Semi fowlers, BP Cuff Size: Adult)   Pulse 96   Temp 96.9 ??F (36.1 ??C) (Oral)   Resp 17   Ht 5' 6" (1.676 m)   Wt 181 lb (82.1 kg)   SpO2 98%   BMI 29.21 kg/m??     Physical Exam:   AA&O x3. Not pale, not in any distress.  HEENT: ENT negative..  Lungs:  clear  Heart: S1 S2 +, RRR  Neuro: No focal deficits.  Psych: Normal affect and mood.  Results for orders placed or performed during the hospital encounter of 01/19/20   CBC WITH AUTOMATED DIFF   Result Value Ref Range    WBC 9.4 4.6 - 13.2 K/uL    RBC 5.04 4.35 - 5.65 M/uL    HGB 16.3 (H) 13.0 - 16.0 g/dL    HCT 46.5 36.0 - 48.0 %    MCV 92.3 74.0 - 97.0 FL    MCH 32.3 24.0 - 34.0 PG    MCHC 35.1 31.0 - 37.0 g/dL    RDW 12.1 11.6 - 14.5 %    PLATELET 312 135 - 420 K/uL    MPV 9.2 9.2 - 11.8 FL    NEUTROPHILS 69 40 - 73 %    LYMPHOCYTES 21 21 - 52 %    MONOCYTES 7 3 - 10 %    EOSINOPHILS 3 0 - 5 %    BASOPHILS 0 0 - 2 %    ABS. NEUTROPHILS 6.4 1.8 - 8.0 K/UL    ABS. LYMPHOCYTES 1.9 0.9 - 3.6 K/UL    ABS. MONOCYTES 0.6 0.05 - 1.2 K/UL    ABS. EOSINOPHILS 0.3 0.0 - 0.4 K/UL    ABS. BASOPHILS 0.0 0.0 - 0.1 K/UL    DF AUTOMATED     METABOLIC PANEL, COMPREHENSIVE   Result Value Ref Range    Sodium 138 136 - 145 mmol/L    Potassium 3.8 3.5 - 5.5 mmol/L    Chloride 106 100 - 111 mmol/L    CO2 29 21 - 32 mmol/L    Anion gap 3 3.0 - 18 mmol/L    Glucose 90 74 - 99 mg/dL    BUN 12 7.0 - 18 MG/DL    Creatinine 0.92 0.6 - 1.3 MG/DL    BUN/Creatinine ratio 13 12 - 20      GFR est AA >60 >60 ml/min/1.13m    GFR est non-AA >60 >60 ml/min/1.740m   Calcium 9.1 8.5 - 10.1 MG/DL    Bilirubin, total 0.6 0.2 - 1.0 MG/DL    ALT (SGPT) 150 (H) 16 - 61 U/L    AST (SGOT) 43 (H) 10 - 38 U/L    Alk. phosphatase 115 45 - 117 U/L    Protein, total 8.3 (H) 6.4 - 8.2 g/dL    Albumin 4.0 3.4 - 5.0 g/dL    Globulin 4.3 (H) 2.0 - 4.0 g/dL    A-G Ratio 0.9 0.8 - 1.7           PrElpidio AnisMD, FACP, SFTexas [facility] [place]

## 2020-08-21 ENCOUNTER — Encounter: Payer: BLUE CROSS/BLUE SHIELD | Attending: Internal Medicine | Primary: Gerontology

## 2020-10-28 ENCOUNTER — Encounter: Attending: Internal Medicine | Primary: Gerontology

## 2021-06-10 MED FILL — FLUARIX QUADRIVALENT 0.5ML SUSY: 0.5 mL | 1 days supply | Qty: 0.5 | Fill #0 | Status: AC

## 2024-08-22 ENCOUNTER — Ambulatory Visit
Admission: EM | Admit: 2024-08-22 | Discharge: 2024-08-22 | Disposition: A | Discharge status: Home / Self Care | Source: Home / Self Care | Attending: Family Medicine | Admitting: Family Medicine

## 2024-08-22 ENCOUNTER — Other Ambulatory Visit: Payer: Self-pay

## 2024-08-22 DIAGNOSIS — Z23 Encounter for immunization: Secondary | ICD-10-CM

## 2024-08-22 DIAGNOSIS — Z203 Contact with and (suspected) exposure to rabies: Secondary | ICD-10-CM | POA: Diagnosis not present

## 2024-08-22 MED ORDER — TETANUS-DIPHTH-ACELL PERTUSSIS 5-2-15.5 LF-MCG/0.5 IM SUSP
0.5000 mL | Freq: Once | INTRAMUSCULAR | Status: AC
  Administered 2024-08-22: 0.5 mL via INTRAMUSCULAR

## 2024-08-22 MED ORDER — RABIES VIRUS VACCINE, HDC IM SUSR
1.0000 mL | Freq: Once | INTRAMUSCULAR | Status: AC
  Administered 2024-08-22: 1 mL via INTRAMUSCULAR

## 2024-08-22 MED ORDER — RABIES IMMUNE GLOBULIN 1500 UNIT/10ML IJ SOLN
20.0000 [IU]/kg | Freq: Once | INTRAMUSCULAR | Status: AC
  Administered 2024-08-22: 1500 [IU] via INTRAMUSCULAR

## 2024-08-22 NOTE — ED Triage Notes (Signed)
 C/O bat exposure last night, no know bite.Animal control notified.

## 2024-08-22 NOTE — ED Provider Notes (Signed)
 " [facility]    CSN: [id number] Arrival date & time: 08/22/24  1250      History   Chief Complaint Chief Complaint  Patient presents with   bat exposure    HPI Tommy Crawford is a 34 y.o. male.   HPI  Here for possible bite by a bat and concern for exposure to rabies.  Last night he notes a partner awoke to find a bat flying in close proximity to them.  He has not certain if there was any bite.  NKDA  Uncertain when last tetanus was.  No chronic conditions History reviewed. No pertinent past medical history.  Patient Active Problem List   Diagnosis Date Noted   Attention deficit disorder 07/24/2008    History reviewed. No pertinent surgical history.     Home Medications    Prior to Admission medications  Medication Sig Start Date End Date Taking? Authorizing Provider  lisdexamfetamine (VYVANSE) 70 MG capsule Take 70 mg by mouth daily.   Yes [provider]  ADDERALL XR 30 MG 24 hr capsule Take 30 mg by mouth every morning. 06/16/16   [provider]  finasteride (PROPECIA) 1 MG tablet Take 1 mg by mouth daily. 05/28/16   [provider]    Family History No family history on file.  Social History Social History[1]   Allergies   Patient has no known allergies.   Review of Systems Review of Systems   Physical Exam Triage Vital Signs ED Triage Vitals  Encounter Vitals Group     BP 08/22/24 1320 126/88     Girls Systolic BP Percentile --      Girls Diastolic BP Percentile --      Boys Systolic BP Percentile --      Boys Diastolic BP Percentile --      Pulse Rate 08/22/24 1320 100     Resp 08/22/24 1320 18     Temp 08/22/24 1320 98.5 F (36.9 C)     Temp Source 08/22/24 1320 Oral     SpO2 08/22/24 1320 97 %     Weight 08/22/24 1322 171 lb 12.8 oz (77.9 kg)     Height --      Head Circumference --      Peak Flow --      Pain Score 08/22/24 1322 0     Pain Loc --      Pain Education --      Exclude  from Growth Chart --    No data found.  Updated Vital Signs BP 126/88 (BP Location: Right Arm)   Pulse 100   Temp 98.5 F (36.9 C) (Oral)   Resp 18   Wt 77.9 kg   SpO2 97%   BMI 26.91 kg/m   Visual Acuity Right Eye Distance:   Left Eye Distance:   Bilateral Distance:    Right Eye Near:   Left Eye Near:    Bilateral Near:     Physical Exam Vitals reviewed.  Constitutional:      General: He is not in acute distress.    Appearance: He is not ill-appearing, toxic-appearing or diaphoretic.  HENT:     Mouth/Throat:     Mouth: Mucous membranes are moist.  Cardiovascular:     Rate and Rhythm: Normal rate and regular rhythm.     Heart sounds: No murmur heard. Pulmonary:     Effort: Pulmonary effort is normal.     Breath sounds: Normal breath sounds.  Skin:  Coloration: Skin is not jaundiced or pale.  Neurological:     Mental Status: He is alert and oriented to person, place, and time.  Psychiatric:        Behavior: Behavior normal.      UC Treatments / Results  Labs (all labs ordered are listed, but only abnormal results are displayed) Labs Reviewed - No data to display  EKG   Radiology No results found.  Procedures Procedures (including critical care time)  Medications Ordered in UC Medications  Rabies Immune Globulin  SOLN 1,500 Units (has no administration in time range)  rabies vaccine , human diploid (IMOVAX) injection 1 mL (has no administration in time range)  Tdap (ADACEL) injection 0.5 mL (has no administration in time range)    Initial Impression / Assessment and Plan / UC Course  I have reviewed the triage vital signs and the nursing notes.  Pertinent labs & imaging results that were available during my care of the patient were reviewed by me and considered in my medical decision making (see chart for details).     Tetanus booster was given today rabies immunoglobulin is given today and the first rabies vaccine  is given  He is given  instructions on when to return for subsequent rabies vaccines.    Final Clinical Impressions(s) / UC Diagnoses   Final diagnoses:  Contact with and suspected exposure to rabies     Discharge Instructions      You have been given a Tdap vaccination to boost your tetanus immunity  You have been given immunoglobulin which is antibodies against the rabies virus You have been given 1 dose of the rabies vaccine  also today.  Please return on the following dates for subsequent rabies vaccines:  2/6 2/10 2/17  You can make an online appointment to reduce your weight    ED Prescriptions   None    PDMP not reviewed this encounter.    [1]  Social History Tobacco Use   Smoking status: Never   Smokeless tobacco: Never  Substance Use Topics   Alcohol use: Yes    Comment: weekly   Drug use: Not Currently     Vonna Sharlet POUR, MD 08/22/24 1420  "

## 2024-08-22 NOTE — Discharge Instructions (Signed)
 You have been given a Tdap vaccination to boost your tetanus immunity  You have been given immunoglobulin which is antibodies against the rabies virus You have been given 1 dose of the rabies vaccine  also today.  Please return on the following dates for subsequent rabies vaccines:  2/6 2/10 2/17  You can make an online appointment to reduce your weight

## 2024-08-25 ENCOUNTER — Inpatient Hospital Stay: Admission: RE | Admit: 2024-08-25 | Discharge: 2024-08-25

## 2024-08-25 DIAGNOSIS — Z23 Encounter for immunization: Secondary | ICD-10-CM | POA: Diagnosis not present

## 2024-08-25 MED ORDER — RABIES VACCINE, PCEC IM SUSR
1.0000 mL | Freq: Once | INTRAMUSCULAR | Status: AC
  Administered 2024-08-25: 1 mL via INTRAMUSCULAR
